# Patient Record
Sex: Female | Born: 1945 | Race: White | Hispanic: No | Smoking: Former smoker
Health system: Southern US, Community
[De-identification: ages and names within clinical notes are randomized; demographics above are authoritative.]

## PROBLEM LIST (undated history)

## (undated) DIAGNOSIS — J449 Chronic obstructive pulmonary disease, unspecified: Secondary | ICD-10-CM

## (undated) DIAGNOSIS — F419 Anxiety disorder, unspecified: Secondary | ICD-10-CM

## (undated) DIAGNOSIS — J9620 Acute and chronic respiratory failure, unspecified whether with hypoxia or hypercapnia: Secondary | ICD-10-CM

## (undated) DIAGNOSIS — I639 Cerebral infarction, unspecified: Secondary | ICD-10-CM

## (undated) DIAGNOSIS — N189 Chronic kidney disease, unspecified: Secondary | ICD-10-CM

## (undated) DIAGNOSIS — C50919 Malignant neoplasm of unspecified site of unspecified female breast: Secondary | ICD-10-CM

## (undated) DIAGNOSIS — I509 Heart failure, unspecified: Secondary | ICD-10-CM

## (undated) DIAGNOSIS — I2510 Atherosclerotic heart disease of native coronary artery without angina pectoris: Secondary | ICD-10-CM

## (undated) DIAGNOSIS — I10 Essential (primary) hypertension: Secondary | ICD-10-CM

## (undated) DIAGNOSIS — E119 Type 2 diabetes mellitus without complications: Secondary | ICD-10-CM

## (undated) HISTORY — PX: JOINT REPLACEMENT: SHX530

## (undated) HISTORY — PX: ABDOMINAL HYSTERECTOMY: SHX81

## (undated) HISTORY — PX: CORONARY ARTERY BYPASS GRAFT: SHX141

---

## 2003-11-18 ENCOUNTER — Other Ambulatory Visit: Payer: Self-pay

## 2003-11-18 ENCOUNTER — Inpatient Hospital Stay: Payer: Self-pay | Admitting: Internal Medicine

## 2003-11-20 ENCOUNTER — Other Ambulatory Visit: Payer: Self-pay

## 2003-11-21 ENCOUNTER — Other Ambulatory Visit: Payer: Self-pay

## 2005-10-06 ENCOUNTER — Ambulatory Visit: Payer: Self-pay | Admitting: Otolaryngology

## 2005-10-21 ENCOUNTER — Ambulatory Visit: Payer: Self-pay | Admitting: Otolaryngology

## 2006-02-23 ENCOUNTER — Ambulatory Visit: Payer: Self-pay | Admitting: Internal Medicine

## 2006-11-10 ENCOUNTER — Encounter: Payer: Self-pay | Admitting: Otolaryngology

## 2006-11-17 ENCOUNTER — Other Ambulatory Visit: Payer: Self-pay

## 2006-11-17 ENCOUNTER — Observation Stay: Payer: Self-pay | Admitting: Internal Medicine

## 2006-11-18 ENCOUNTER — Other Ambulatory Visit: Payer: Self-pay

## 2006-11-27 ENCOUNTER — Encounter: Payer: Self-pay | Admitting: Otolaryngology

## 2006-12-27 ENCOUNTER — Encounter: Payer: Self-pay | Admitting: Otolaryngology

## 2007-01-27 ENCOUNTER — Encounter: Payer: Self-pay | Admitting: Otolaryngology

## 2007-10-20 ENCOUNTER — Other Ambulatory Visit: Payer: Self-pay

## 2007-10-20 ENCOUNTER — Inpatient Hospital Stay: Payer: Self-pay | Admitting: Internal Medicine

## 2011-01-25 ENCOUNTER — Inpatient Hospital Stay: Payer: Self-pay | Admitting: *Deleted

## 2011-01-29 LAB — CBC WITH DIFFERENTIAL/PLATELET
Basophil #: 0 10*3/uL (ref 0.0–0.1)
Eosinophil #: 0 10*3/uL (ref 0.0–0.7)
Lymphocyte #: 0.6 10*3/uL — ABNORMAL LOW (ref 1.0–3.6)
Lymphocyte %: 3.4 %
MCHC: 33.2 g/dL (ref 32.0–36.0)
MCV: 86 fL (ref 80–100)
Monocyte #: 0.7 10*3/uL (ref 0.0–0.7)
Monocyte %: 4 %
Neutrophil #: 17 10*3/uL — ABNORMAL HIGH (ref 1.4–6.5)
Platelet: 299 10*3/uL (ref 150–440)
RBC: 4.53 10*6/uL (ref 3.80–5.20)
RDW: 13.6 % (ref 11.5–14.5)
WBC: 18.4 10*3/uL — ABNORMAL HIGH (ref 3.6–11.0)

## 2011-01-29 LAB — BASIC METABOLIC PANEL
Anion Gap: 10 (ref 7–16)
EGFR (African American): 32 — ABNORMAL LOW
EGFR (Non-African Amer.): 26 — ABNORMAL LOW
Glucose: 327 mg/dL — ABNORMAL HIGH (ref 65–99)
Osmolality: 296 (ref 275–301)
Potassium: 4.5 mmol/L (ref 3.5–5.1)
Sodium: 130 mmol/L — ABNORMAL LOW (ref 136–145)

## 2011-01-30 LAB — BASIC METABOLIC PANEL
Anion Gap: 11 (ref 7–16)
Chloride: 93 mmol/L — ABNORMAL LOW (ref 98–107)
Co2: 26 mmol/L (ref 21–32)
EGFR (Non-African Amer.): 31 — ABNORMAL LOW
Glucose: 290 mg/dL — ABNORMAL HIGH (ref 65–99)
Osmolality: 300 (ref 275–301)
Potassium: 4.7 mmol/L (ref 3.5–5.1)

## 2011-01-30 LAB — CBC WITH DIFFERENTIAL/PLATELET
Basophil #: 0 10*3/uL (ref 0.0–0.1)
Eosinophil #: 0 10*3/uL (ref 0.0–0.7)
HCT: 38.3 % (ref 35.0–47.0)
Lymphocyte #: 0.9 10*3/uL — ABNORMAL LOW (ref 1.0–3.6)
Lymphocyte %: 5.8 %
MCH: 28.7 pg (ref 26.0–34.0)
MCHC: 33.7 g/dL (ref 32.0–36.0)
MCV: 85 fL (ref 80–100)
Neutrophil #: 12.9 10*3/uL — ABNORMAL HIGH (ref 1.4–6.5)
RBC: 4.5 10*6/uL (ref 3.80–5.20)
RDW: 13.6 % (ref 11.5–14.5)

## 2011-01-31 LAB — CBC WITH DIFFERENTIAL/PLATELET
Basophil #: 0 10*3/uL (ref 0.0–0.1)
Eosinophil #: 0 10*3/uL (ref 0.0–0.7)
HCT: 38.9 % (ref 35.0–47.0)
HGB: 13.1 g/dL (ref 12.0–16.0)
Lymphocyte %: 10.4 %
MCHC: 33.6 g/dL (ref 32.0–36.0)
Monocyte #: 1.7 10*3/uL — ABNORMAL HIGH (ref 0.0–0.7)
Monocyte %: 10.7 %
Neutrophil #: 12.5 10*3/uL — ABNORMAL HIGH (ref 1.4–6.5)
Neutrophil %: 78.7 %
Platelet: 339 10*3/uL (ref 150–440)
RDW: 13.6 % (ref 11.5–14.5)
WBC: 15.9 10*3/uL — ABNORMAL HIGH (ref 3.6–11.0)

## 2011-01-31 LAB — BASIC METABOLIC PANEL
Anion Gap: 9 (ref 7–16)
BUN: 91 mg/dL — ABNORMAL HIGH (ref 7–18)
Calcium, Total: 8.8 mg/dL (ref 8.5–10.1)
Chloride: 93 mmol/L — ABNORMAL LOW (ref 98–107)
Co2: 30 mmol/L (ref 21–32)
Creatinine: 1.71 mg/dL — ABNORMAL HIGH (ref 0.60–1.30)
EGFR (African American): 39 — ABNORMAL LOW
Osmolality: 299 (ref 275–301)
Potassium: 4.6 mmol/L (ref 3.5–5.1)

## 2011-01-31 LAB — PHOSPHORUS: Phosphorus: 3.6 mg/dL (ref 2.5–4.9)

## 2011-02-01 LAB — BASIC METABOLIC PANEL
Anion Gap: 12 (ref 7–16)
BUN: 80 mg/dL — ABNORMAL HIGH (ref 7–18)
Co2: 29 mmol/L (ref 21–32)
Creatinine: 1.49 mg/dL — ABNORMAL HIGH (ref 0.60–1.30)
EGFR (African American): 45 — ABNORMAL LOW
EGFR (Non-African Amer.): 37 — ABNORMAL LOW
Glucose: 230 mg/dL — ABNORMAL HIGH (ref 65–99)
Potassium: 4.5 mmol/L (ref 3.5–5.1)
Sodium: 135 mmol/L — ABNORMAL LOW (ref 136–145)

## 2011-02-01 LAB — CREATININE CLEARANCE, URINE, 24 HOUR
Creatinine, Serum: 1.49 mg/dL — ABNORMAL HIGH (ref 0.50–1.20)
Creatinine, Urine: 68.4 mg/dL (ref 30.0–125.0)
Total Volume: 2300 mL

## 2011-02-12 ENCOUNTER — Encounter: Payer: Self-pay | Admitting: Internal Medicine

## 2011-02-12 LAB — BASIC METABOLIC PANEL
Calcium, Total: 10 mg/dL (ref 8.5–10.1)
Co2: 25 mmol/L (ref 21–32)
EGFR (Non-African Amer.): 47 — ABNORMAL LOW
Glucose: 157 mg/dL — ABNORMAL HIGH (ref 65–99)
Osmolality: 289 (ref 275–301)
Potassium: 4 mmol/L (ref 3.5–5.1)
Sodium: 142 mmol/L (ref 136–145)

## 2011-02-25 ENCOUNTER — Ambulatory Visit: Payer: Self-pay | Admitting: Internal Medicine

## 2011-02-27 ENCOUNTER — Encounter: Payer: Self-pay | Admitting: Internal Medicine

## 2011-03-24 LAB — CBC
HCT: 36.5 % (ref 35.0–47.0)
MCHC: 32.9 g/dL (ref 32.0–36.0)
MCV: 85 fL (ref 80–100)
Platelet: 319 10*3/uL (ref 150–440)
RDW: 14.6 % — ABNORMAL HIGH (ref 11.5–14.5)
WBC: 16.4 10*3/uL — ABNORMAL HIGH (ref 3.6–11.0)

## 2011-03-24 LAB — COMPREHENSIVE METABOLIC PANEL
Alkaline Phosphatase: 61 U/L (ref 50–136)
BUN: 41 mg/dL — ABNORMAL HIGH (ref 7–18)
Bilirubin,Total: 0.2 mg/dL (ref 0.2–1.0)
Chloride: 102 mmol/L (ref 98–107)
Creatinine: 1.79 mg/dL — ABNORMAL HIGH (ref 0.60–1.30)
EGFR (African American): 37 — ABNORMAL LOW
EGFR (Non-African Amer.): 30 — ABNORMAL LOW
Osmolality: 290 (ref 275–301)
SGPT (ALT): 17 U/L
Total Protein: 7.6 g/dL (ref 6.4–8.2)

## 2011-03-25 ENCOUNTER — Inpatient Hospital Stay: Payer: Self-pay | Admitting: Internal Medicine

## 2011-03-25 LAB — CK TOTAL AND CKMB (NOT AT ARMC)
CK, Total: 44 U/L (ref 21–215)
CK-MB: 0.6 ng/mL (ref 0.5–3.6)

## 2011-03-25 LAB — TROPONIN I
Troponin-I: 0.03 ng/mL
Troponin-I: 0.02 ng/mL

## 2011-03-26 LAB — BASIC METABOLIC PANEL
BUN: 42 mg/dL — ABNORMAL HIGH (ref 7–18)
Calcium, Total: 9.6 mg/dL (ref 8.5–10.1)
Chloride: 103 mmol/L (ref 98–107)
Co2: 25 mmol/L (ref 21–32)
Creatinine: 1.65 mg/dL — ABNORMAL HIGH (ref 0.60–1.30)
EGFR (African American): 40 — ABNORMAL LOW
EGFR (Non-African Amer.): 33 — ABNORMAL LOW
Osmolality: 291 (ref 275–301)
Potassium: 4.1 mmol/L (ref 3.5–5.1)
Sodium: 141 mmol/L (ref 136–145)

## 2011-07-15 ENCOUNTER — Emergency Department: Payer: Self-pay

## 2011-07-15 LAB — URINALYSIS, COMPLETE
Blood: NEGATIVE
Glucose,UR: NEGATIVE mg/dL (ref 0–75)
Ketone: NEGATIVE
Nitrite: POSITIVE
Ph: 5 (ref 4.5–8.0)
Protein: 100
RBC,UR: 11 /HPF (ref 0–5)
Specific Gravity: 1.027 (ref 1.003–1.030)
Squamous Epithelial: 2
WBC UR: 693 /HPF (ref 0–5)

## 2011-07-15 LAB — BASIC METABOLIC PANEL
Anion Gap: 9 (ref 7–16)
BUN: 29 mg/dL — ABNORMAL HIGH (ref 7–18)
Chloride: 104 mmol/L (ref 98–107)
EGFR (African American): >60
Glucose: 92 mg/dL (ref 65–99)
Sodium: 139 mmol/L (ref 136–145)

## 2011-07-15 LAB — TROPONIN I: Troponin-I: <0.02 ng/mL

## 2011-07-15 LAB — CBC
HCT: 36 % (ref 35.0–47.0)
HGB: 11.3 g/dL — ABNORMAL LOW (ref 12.0–16.0)
MCV: 82 fL (ref 80–100)
RDW: 17.1 % — ABNORMAL HIGH (ref 11.5–14.5)

## 2011-07-15 LAB — CK TOTAL AND CKMB (NOT AT ARMC): CK-MB: 1.4 ng/mL (ref 0.5–3.6)

## 2011-07-15 LAB — PRO B NATRIURETIC PEPTIDE: B-Type Natriuretic Peptide: 842 pg/mL — ABNORMAL HIGH (ref 0–125)

## 2011-07-22 ENCOUNTER — Inpatient Hospital Stay: Payer: Self-pay | Admitting: Internal Medicine

## 2011-07-22 LAB — PROTIME-INR
INR: 1.3
Prothrombin Time: 16.3 secs — ABNORMAL HIGH (ref 11.5–14.7)

## 2011-07-22 LAB — BASIC METABOLIC PANEL
BUN: 50 mg/dL — ABNORMAL HIGH (ref 7–18)
Calcium, Total: 8.9 mg/dL (ref 8.5–10.1)
Chloride: 103 mmol/L (ref 98–107)
Creatinine: 2.24 mg/dL — ABNORMAL HIGH (ref 0.60–1.30)
EGFR (African American): 26 — ABNORMAL LOW
EGFR (Non-African Amer.): 22 — ABNORMAL LOW
Glucose: 134 mg/dL — ABNORMAL HIGH (ref 65–99)
Potassium: 4.9 mmol/L (ref 3.5–5.1)
Sodium: 136 mmol/L (ref 136–145)

## 2011-07-22 LAB — CBC
HCT: 33.6 % — ABNORMAL LOW (ref 35.0–47.0)
MCHC: 32.4 g/dL (ref 32.0–36.0)
MCV: 81 fL (ref 80–100)
RBC: 4.14 10*6/uL (ref 3.80–5.20)
RDW: 17 % — ABNORMAL HIGH (ref 11.5–14.5)
WBC: 20.4 10*3/uL — ABNORMAL HIGH (ref 3.6–11.0)

## 2011-07-22 LAB — CK TOTAL AND CKMB (NOT AT ARMC): CK-MB: 6.4 ng/mL — ABNORMAL HIGH (ref 0.5–3.6)

## 2011-07-23 LAB — CBC WITH DIFFERENTIAL/PLATELET
Basophil %: 0.2 %
Eosinophil #: 0.6 10*3/uL (ref 0.0–0.7)
Eosinophil %: 3.6 %
HCT: 33.7 % — ABNORMAL LOW (ref 35.0–47.0)
HGB: 10.7 g/dL — ABNORMAL LOW (ref 12.0–16.0)
Lymphocyte #: 1 10*3/uL (ref 1.0–3.6)
Lymphocyte %: 5.4 %
MCH: 26.1 pg (ref 26.0–34.0)
MCHC: 31.8 g/dL — ABNORMAL LOW (ref 32.0–36.0)
MCV: 82 fL (ref 80–100)
Monocyte #: 0.4 x10 3/mm (ref 0.2–0.9)
Monocyte %: 2.4 %
Neutrophil #: 15.7 10*3/uL — ABNORMAL HIGH (ref 1.4–6.5)
Neutrophil %: 88.4 %
Platelet: 282 10*3/uL (ref 150–440)
RBC: 4.11 10*6/uL (ref 3.80–5.20)
RDW: 16.8 % — ABNORMAL HIGH (ref 11.5–14.5)

## 2011-07-23 LAB — CK TOTAL AND CKMB (NOT AT ARMC)
CK-MB: 5 ng/mL — ABNORMAL HIGH (ref 0.5–3.6)
CK-MB: 3.3 ng/mL (ref 0.5–3.6)

## 2011-07-23 LAB — LIPID PANEL
HDL Cholesterol: 28 mg/dL — ABNORMAL LOW (ref 40–60)
Triglycerides: 138 mg/dL (ref 0–200)
VLDL Cholesterol, Calc: 28 mg/dL (ref 5–40)

## 2011-07-23 LAB — BASIC METABOLIC PANEL
Anion Gap: 9 (ref 7–16)
Chloride: 99 mmol/L (ref 98–107)
Creatinine: 2.27 mg/dL — ABNORMAL HIGH (ref 0.60–1.30)
EGFR (African American): 25 — ABNORMAL LOW
Osmolality: 289 (ref 275–301)
Sodium: 133 mmol/L — ABNORMAL LOW (ref 136–145)

## 2011-07-23 LAB — APTT
Activated PTT: 35.8 secs (ref 23.6–35.9)
Activated PTT: 29.8 secs (ref 23.6–35.9)

## 2011-07-23 LAB — HEMOGLOBIN A1C: Hemoglobin A1C: 6.2 % (ref 4.2–6.3)

## 2011-07-23 LAB — TROPONIN I: Troponin-I: 1.3 ng/mL — ABNORMAL HIGH

## 2011-07-23 LAB — POTASSIUM: Potassium: 4.8 mmol/L (ref 3.5–5.1)

## 2011-07-24 LAB — URINALYSIS, COMPLETE
Glucose,UR: 50 mg/dL (ref 0–75)
Ketone: NEGATIVE
Protein: 30
RBC,UR: <1 /HPF (ref 0–5)
Squamous Epithelial: 10
WBC UR: 1 /HPF (ref 0–5)

## 2011-07-24 LAB — CBC WITH DIFFERENTIAL/PLATELET
Basophil #: 0.2 10*3/uL — ABNORMAL HIGH (ref 0.0–0.1)
Basophil %: 1.1 %
Eosinophil #: 0.1 10*3/uL (ref 0.0–0.7)
Eosinophil %: 0.3 %
HCT: 33.6 % — ABNORMAL LOW (ref 35.0–47.0)
HGB: 10.3 g/dL — ABNORMAL LOW (ref 12.0–16.0)
Lymphocyte %: 7.6 %
MCH: 25.2 pg — ABNORMAL LOW (ref 26.0–34.0)
Monocyte #: 0.9 x10 3/mm (ref 0.2–0.9)
Monocyte %: 4.4 %
RBC: 4.11 10*6/uL (ref 3.80–5.20)
RDW: 16.8 % — ABNORMAL HIGH (ref 11.5–14.5)
WBC: 21.1 10*3/uL — ABNORMAL HIGH (ref 3.6–11.0)

## 2011-07-24 LAB — APTT
Activated PTT: <23 secs — ABNORMAL LOW (ref 23.6–35.9)
Activated PTT: 49.5 secs — ABNORMAL HIGH (ref 23.6–35.9)

## 2011-07-24 LAB — BASIC METABOLIC PANEL
Anion Gap: 11 (ref 7–16)
Calcium, Total: 9.1 mg/dL (ref 8.5–10.1)
Chloride: 94 mmol/L — ABNORMAL LOW (ref 98–107)
Co2: 25 mmol/L (ref 21–32)
EGFR (Non-African Amer.): 30 — ABNORMAL LOW
Glucose: 410 mg/dL — ABNORMAL HIGH (ref 65–99)
Osmolality: 295 (ref 275–301)
Potassium: 5 mmol/L (ref 3.5–5.1)
Sodium: 130 mmol/L — ABNORMAL LOW (ref 136–145)

## 2011-07-25 LAB — CBC WITH DIFFERENTIAL/PLATELET
Basophil #: 0 10*3/uL (ref 0.0–0.1)
HCT: 35.5 % (ref 35.0–47.0)
MCH: 25.3 pg — ABNORMAL LOW (ref 26.0–34.0)
MCHC: 31 g/dL — ABNORMAL LOW (ref 32.0–36.0)
MCV: 82 fL (ref 80–100)
Monocyte %: 5.3 %
Neutrophil %: 88.7 %
Platelet: 371 10*3/uL (ref 150–440)
RBC: 4.34 10*6/uL (ref 3.80–5.20)

## 2011-08-02 ENCOUNTER — Inpatient Hospital Stay: Payer: Self-pay | Admitting: Internal Medicine

## 2011-08-02 LAB — CBC
HCT: 37.4 % (ref 35.0–47.0)
HGB: 11.5 g/dL — ABNORMAL LOW (ref 12.0–16.0)
MCV: 83 fL (ref 80–100)
Platelet: 355 10*3/uL (ref 150–440)
RBC: 4.49 10*6/uL (ref 3.80–5.20)
RDW: 17.5 % — ABNORMAL HIGH (ref 11.5–14.5)
WBC: 23.9 10*3/uL — ABNORMAL HIGH (ref 3.6–11.0)

## 2011-08-02 LAB — COMPREHENSIVE METABOLIC PANEL
Alkaline Phosphatase: 83 U/L (ref 50–136)
Calcium, Total: 9.5 mg/dL (ref 8.5–10.1)
Chloride: 103 mmol/L (ref 98–107)
Co2: 28 mmol/L (ref 21–32)
Creatinine: 1.87 mg/dL — ABNORMAL HIGH (ref 0.60–1.30)
EGFR (African American): 32 — ABNORMAL LOW
EGFR (Non-African Amer.): 28 — ABNORMAL LOW
Osmolality: 282 (ref 275–301)
Potassium: 4.9 mmol/L (ref 3.5–5.1)
SGOT(AST): 17 U/L (ref 15–37)
SGPT (ALT): 24 U/L
Sodium: 136 mmol/L (ref 136–145)

## 2011-08-02 LAB — URINALYSIS, COMPLETE
Blood: NEGATIVE
Glucose,UR: NEGATIVE mg/dL (ref 0–75)
Hyaline Cast: 3
Nitrite: NEGATIVE
Ph: 5 (ref 4.5–8.0)
Protein: 100
RBC,UR: 3 /HPF (ref 0–5)
WBC UR: 18 /HPF (ref 0–5)

## 2011-08-02 LAB — CK TOTAL AND CKMB (NOT AT ARMC)
CK, Total: 37 U/L (ref 21–215)
CK-MB: 1.7 ng/mL (ref 0.5–3.6)

## 2011-08-02 LAB — TROPONIN I: Troponin-I: 0.11 ng/mL — ABNORMAL HIGH

## 2011-08-03 LAB — COMPREHENSIVE METABOLIC PANEL
Albumin: 2.5 g/dL — ABNORMAL LOW (ref 3.4–5.0)
Alkaline Phosphatase: 75 U/L (ref 50–136)
Anion Gap: 8 (ref 7–16)
BUN: 48 mg/dL — ABNORMAL HIGH (ref 7–18)
Bilirubin,Total: 0.3 mg/dL (ref 0.2–1.0)
Chloride: 102 mmol/L (ref 98–107)
Creatinine: 1.92 mg/dL — ABNORMAL HIGH (ref 0.60–1.30)
EGFR (African American): 31 — ABNORMAL LOW
Glucose: 145 mg/dL — ABNORMAL HIGH (ref 65–99)
Osmolality: 291 (ref 275–301)
Potassium: 4.8 mmol/L (ref 3.5–5.1)
SGOT(AST): 16 U/L (ref 15–37)
Sodium: 138 mmol/L (ref 136–145)
Total Protein: 6.6 g/dL (ref 6.4–8.2)

## 2011-08-03 LAB — LIPID PANEL
Ldl Cholesterol, Calc: 45 mg/dL (ref 0–100)
Triglycerides: 110 mg/dL (ref 0–200)
VLDL Cholesterol, Calc: 22 mg/dL (ref 5–40)

## 2011-08-03 LAB — CK TOTAL AND CKMB (NOT AT ARMC)
CK, Total: 48 U/L (ref 21–215)
CK-MB: 2 ng/mL (ref 0.5–3.6)
CK-MB: 1.5 ng/mL (ref 0.5–3.6)

## 2011-08-03 LAB — CBC WITH DIFFERENTIAL/PLATELET
HGB: 10.8 g/dL — ABNORMAL LOW (ref 12.0–16.0)
Lymphocytes: 10 %
MCH: 26 pg (ref 26.0–34.0)
MCHC: 31.3 g/dL — ABNORMAL LOW (ref 32.0–36.0)
MCV: 83 fL (ref 80–100)
Platelet: 250 10*3/uL (ref 150–440)
RBC: 4.16 10*6/uL (ref 3.80–5.20)
RDW: 17.1 % — ABNORMAL HIGH (ref 11.5–14.5)
Segmented Neutrophils: 72 %

## 2011-08-03 LAB — HEMOGLOBIN A1C: Hemoglobin A1C: 6.8 % — ABNORMAL HIGH (ref 4.2–6.3)

## 2011-08-04 LAB — BASIC METABOLIC PANEL
BUN: 38 mg/dL — ABNORMAL HIGH (ref 7–18)
Calcium, Total: 9.3 mg/dL (ref 8.5–10.1)
Chloride: 100 mmol/L (ref 98–107)
Creatinine: 1.66 mg/dL — ABNORMAL HIGH (ref 0.60–1.30)
EGFR (African American): 37 — ABNORMAL LOW
EGFR (Non-African Amer.): 32 — ABNORMAL LOW
Glucose: 143 mg/dL — ABNORMAL HIGH (ref 65–99)
Osmolality: 287 (ref 275–301)
Potassium: 4.4 mmol/L (ref 3.5–5.1)

## 2011-08-08 LAB — CULTURE, BLOOD (SINGLE)

## 2011-08-27 ENCOUNTER — Ambulatory Visit: Payer: Self-pay | Admitting: Internal Medicine

## 2011-08-29 ENCOUNTER — Emergency Department: Payer: Self-pay | Admitting: Emergency Medicine

## 2011-09-10 ENCOUNTER — Inpatient Hospital Stay: Payer: Self-pay | Admitting: Specialist

## 2011-09-10 LAB — BASIC METABOLIC PANEL
BUN: 34 mg/dL — ABNORMAL HIGH (ref 7–18)
Calcium, Total: 8.9 mg/dL (ref 8.5–10.1)
Chloride: 106 mmol/L (ref 98–107)
Co2: 25 mmol/L (ref 21–32)
Creatinine: 1.48 mg/dL — ABNORMAL HIGH (ref 0.60–1.30)
EGFR (Non-African Amer.): 37 — ABNORMAL LOW
Potassium: 4.7 mmol/L (ref 3.5–5.1)
Sodium: 139 mmol/L (ref 136–145)

## 2011-09-10 LAB — CBC WITH DIFFERENTIAL/PLATELET
Basophil #: 0.1 10*3/uL (ref 0.0–0.1)
Basophil #: 0.1 10*3/uL (ref 0.0–0.1)
Basophil #: 0.2 10*3/uL — ABNORMAL HIGH (ref 0.0–0.1)
Basophil %: 0.2 %
Eosinophil #: 0 10*3/uL (ref 0.0–0.7)
Eosinophil #: 0.4 10*3/uL (ref 0.0–0.7)
Eosinophil #: 0.5 10*3/uL (ref 0.0–0.7)
Eosinophil %: 1.6 %
HCT: 37.4 % (ref 35.0–47.0)
HGB: 9.9 g/dL — ABNORMAL LOW (ref 12.0–16.0)
Lymphocyte #: 0.4 10*3/uL — ABNORMAL LOW (ref 1.0–3.6)
Lymphocyte #: 4.5 10*3/uL — ABNORMAL HIGH (ref 1.0–3.6)
Lymphocyte %: 2.4 %
Lymphocyte %: 21.7 %
MCHC: 30.8 g/dL — ABNORMAL LOW (ref 32.0–36.0)
MCHC: 31 g/dL — ABNORMAL LOW (ref 32.0–36.0)
MCV: 84 fL (ref 80–100)
MCV: 84 fL (ref 80–100)
MCV: 86 fL (ref 80–100)
Monocyte #: 0.3 x10 3/mm (ref 0.2–0.9)
Monocyte #: 0.3 x10 3/mm (ref 0.2–0.9)
Neutrophil #: 14.6 10*3/uL — ABNORMAL HIGH (ref 1.4–6.5)
Neutrophil #: 17.5 10*3/uL — ABNORMAL HIGH (ref 1.4–6.5)
Neutrophil %: 70.7 %
Neutrophil %: 95 %
Neutrophil %: 95.2 %
Platelet: 265 10*3/uL (ref 150–440)
Platelet: 332 10*3/uL (ref 150–440)
Platelet: 443 10*3/uL — ABNORMAL HIGH (ref 150–440)
RBC: 3.8 10*6/uL (ref 3.80–5.20)
RBC: 4.34 10*6/uL (ref 3.80–5.20)
RDW: 16.7 % — ABNORMAL HIGH (ref 11.5–14.5)
RDW: 16.8 % — ABNORMAL HIGH (ref 11.5–14.5)
RDW: 16.9 % — ABNORMAL HIGH (ref 11.5–14.5)
WBC: 23 10*3/uL — ABNORMAL HIGH (ref 3.6–11.0)
WBC: 18.3 10*3/uL — ABNORMAL HIGH (ref 3.6–11.0)

## 2011-09-10 LAB — COMPREHENSIVE METABOLIC PANEL
Anion Gap: 11 (ref 7–16)
Calcium, Total: 9.4 mg/dL (ref 8.5–10.1)
Chloride: 104 mmol/L (ref 98–107)
Co2: 22 mmol/L (ref 21–32)
EGFR (African American): 41 — ABNORMAL LOW
Osmolality: 293 (ref 275–301)
Potassium: 5.2 mmol/L — ABNORMAL HIGH (ref 3.5–5.1)
SGOT(AST): 49 U/L — ABNORMAL HIGH (ref 15–37)
SGPT (ALT): 32 U/L (ref 12–78)
Total Protein: 7.7 g/dL (ref 6.4–8.2)

## 2011-09-10 LAB — APTT: Activated PTT: <23 secs — ABNORMAL LOW (ref 23.6–35.9)

## 2011-09-10 LAB — URINALYSIS, COMPLETE
Blood: NEGATIVE
Ph: 6 (ref 4.5–8.0)
Protein: 100
RBC,UR: 7 /HPF (ref 0–5)
Specific Gravity: 1.014 (ref 1.003–1.030)

## 2011-09-10 LAB — PROTIME-INR
INR: 1
Prothrombin Time: 13.1 secs (ref 11.5–14.7)

## 2011-09-10 LAB — CK TOTAL AND CKMB (NOT AT ARMC)
CK, Total: 269 U/L — ABNORMAL HIGH (ref 21–215)
CK, Total: 94 U/L (ref 21–215)
CK-MB: 10.5 ng/mL — ABNORMAL HIGH (ref 0.5–3.6)
CK-MB: 2.1 ng/mL (ref 0.5–3.6)

## 2011-09-10 LAB — TROPONIN I: Troponin-I: 1.29 ng/mL — ABNORMAL HIGH

## 2011-09-11 LAB — CBC WITH DIFFERENTIAL/PLATELET
Basophil #: 0 10*3/uL (ref 0.0–0.1)
Eosinophil #: 0.1 10*3/uL (ref 0.0–0.7)
Eosinophil %: 0.3 %
HGB: 8.7 g/dL — ABNORMAL LOW (ref 12.0–16.0)
Lymphocyte #: 1.7 10*3/uL (ref 1.0–3.6)
MCH: 26.4 pg (ref 26.0–34.0)
MCV: 83 fL (ref 80–100)
Monocyte #: 1.4 x10 3/mm — ABNORMAL HIGH (ref 0.2–0.9)
Monocyte %: 6.7 %
Neutrophil %: 84.4 %
Platelet: 237 10*3/uL (ref 150–440)
RBC: 3.28 10*6/uL — ABNORMAL LOW (ref 3.80–5.20)
WBC: 20.4 10*3/uL — ABNORMAL HIGH (ref 3.6–11.0)

## 2011-09-11 LAB — APTT
Activated PTT: 66.7 secs — ABNORMAL HIGH (ref 23.6–35.9)
Activated PTT: 48.1 secs — ABNORMAL HIGH (ref 23.6–35.9)

## 2011-09-12 LAB — CBC WITH DIFFERENTIAL/PLATELET
Basophil %: 0.5 %
HGB: 8.9 g/dL — ABNORMAL LOW (ref 12.0–16.0)
Lymphocyte #: 2.9 10*3/uL (ref 1.0–3.6)
Lymphocyte %: 20 %
MCHC: 31.9 g/dL — ABNORMAL LOW (ref 32.0–36.0)
MCV: 84 fL (ref 80–100)
Monocyte #: 1.1 x10 3/mm — ABNORMAL HIGH (ref 0.2–0.9)
Monocyte %: 8 %
Neutrophil %: 68 %
RBC: 3.33 10*6/uL — ABNORMAL LOW (ref 3.80–5.20)
WBC: 14.4 10*3/uL — ABNORMAL HIGH (ref 3.6–11.0)

## 2011-09-12 LAB — APTT
Activated PTT: 57 secs — ABNORMAL HIGH (ref 23.6–35.9)
Activated PTT: 72.8 secs — ABNORMAL HIGH (ref 23.6–35.9)

## 2011-09-12 LAB — CREATININE, SERUM
Creatinine: 1.45 mg/dL — ABNORMAL HIGH (ref 0.60–1.30)
EGFR (Non-African Amer.): 37 — ABNORMAL LOW

## 2011-09-12 LAB — URINE CULTURE

## 2011-09-13 LAB — CBC WITH DIFFERENTIAL/PLATELET
Basophil %: 1 %
Eosinophil %: 5.1 %
HCT: 31.5 % — ABNORMAL LOW (ref 35.0–47.0)
HGB: 9.8 g/dL — ABNORMAL LOW (ref 12.0–16.0)
Lymphocyte #: 2.3 10*3/uL (ref 1.0–3.6)
MCH: 26 pg (ref 26.0–34.0)
MCV: 83 fL (ref 80–100)
Monocyte #: 1.1 x10 3/mm — ABNORMAL HIGH (ref 0.2–0.9)
Monocyte %: 8.2 %
Neutrophil #: 9.4 10*3/uL — ABNORMAL HIGH (ref 1.4–6.5)
RBC: 3.78 10*6/uL — ABNORMAL LOW (ref 3.80–5.20)
WBC: 13.7 10*3/uL — ABNORMAL HIGH (ref 3.6–11.0)

## 2011-09-13 LAB — BASIC METABOLIC PANEL
Anion Gap: 6 — ABNORMAL LOW (ref 7–16)
BUN: 24 mg/dL — ABNORMAL HIGH (ref 7–18)
Calcium, Total: 9.2 mg/dL (ref 8.5–10.1)
Chloride: 109 mmol/L — ABNORMAL HIGH (ref 98–107)
Co2: 27 mmol/L (ref 21–32)
Glucose: 138 mg/dL — ABNORMAL HIGH (ref 65–99)
Osmolality: 289 (ref 275–301)
Potassium: 4.2 mmol/L (ref 3.5–5.1)

## 2011-09-13 LAB — IRON AND TIBC
Iron Saturation: 19 %
Iron: 62 ug/dL (ref 50–170)
Unbound Iron-Bind.Cap.: 268 ug/dL

## 2011-09-13 LAB — FERRITIN: Ferritin (ARMC): 77 ng/mL (ref 8–388)

## 2011-09-14 LAB — CBC WITH DIFFERENTIAL/PLATELET
Basophil #: 0.1 10*3/uL (ref 0.0–0.1)
Eosinophil #: 0.6 10*3/uL (ref 0.0–0.7)
Lymphocyte %: 14 %
MCH: 27 pg (ref 26.0–34.0)
MCHC: 32.2 g/dL (ref 32.0–36.0)
Monocyte #: 0.8 x10 3/mm (ref 0.2–0.9)
Neutrophil %: 74.3 %
Platelet: 281 10*3/uL (ref 150–440)
RDW: 16.9 % — ABNORMAL HIGH (ref 11.5–14.5)

## 2011-09-14 LAB — BASIC METABOLIC PANEL
Calcium, Total: 9.4 mg/dL (ref 8.5–10.1)
Creatinine: 1.06 mg/dL (ref 0.60–1.30)
EGFR (African American): >60
EGFR (Non-African Amer.): 55 — ABNORMAL LOW
Glucose: 147 mg/dL — ABNORMAL HIGH (ref 65–99)
Potassium: 4.1 mmol/L (ref 3.5–5.1)
Sodium: 140 mmol/L (ref 136–145)

## 2011-09-16 LAB — CULTURE, BLOOD (SINGLE)

## 2011-09-18 LAB — CULTURE, BLOOD (SINGLE)

## 2011-09-24 ENCOUNTER — Inpatient Hospital Stay: Payer: Self-pay | Admitting: Family Medicine

## 2011-09-24 LAB — PROTIME-INR
INR: 1.1
Prothrombin Time: 14.4 secs (ref 11.5–14.7)

## 2011-09-24 LAB — URINALYSIS, COMPLETE
Bacteria: NONE SEEN
Bilirubin,UR: NEGATIVE
Ketone: NEGATIVE
Leukocyte Esterase: NEGATIVE
Ph: 5 (ref 4.5–8.0)
Protein: >=500
RBC,UR: 6 /HPF (ref 0–5)
Squamous Epithelial: <1
WBC UR: 13 /HPF (ref 0–5)

## 2011-09-24 LAB — CK TOTAL AND CKMB (NOT AT ARMC)
CK, Total: 25 U/L (ref 21–215)
CK, Total: 33 U/L (ref 21–215)
CK, Total: 29 U/L (ref 21–215)
CK-MB: 1.3 ng/mL (ref 0.5–3.6)
CK-MB: 1.9 ng/mL (ref 0.5–3.6)

## 2011-09-24 LAB — COMPREHENSIVE METABOLIC PANEL
Albumin: 3.1 g/dL — ABNORMAL LOW (ref 3.4–5.0)
Alkaline Phosphatase: 73 U/L (ref 50–136)
BUN: 30 mg/dL — ABNORMAL HIGH (ref 7–18)
Bilirubin,Total: 0.3 mg/dL (ref 0.2–1.0)
Creatinine: 1.52 mg/dL — ABNORMAL HIGH (ref 0.60–1.30)
Osmolality: 294 (ref 275–301)
SGOT(AST): 20 U/L (ref 15–37)
SGPT (ALT): 22 U/L (ref 12–78)
Sodium: 141 mmol/L (ref 136–145)
Total Protein: 7.2 g/dL (ref 6.4–8.2)

## 2011-09-24 LAB — CBC WITH DIFFERENTIAL/PLATELET
Basophil %: 0.4 %
Eosinophil %: 1.2 %
HCT: 32.7 % — ABNORMAL LOW (ref 35.0–47.0)
MCH: 27.4 pg (ref 26.0–34.0)
MCV: 85 fL (ref 80–100)
Monocyte %: 4 %
Neutrophil %: 90.2 %
Platelet: 395 10*3/uL (ref 150–440)
RBC: 3.85 10*6/uL (ref 3.80–5.20)
WBC: 25.8 10*3/uL — ABNORMAL HIGH (ref 3.6–11.0)

## 2011-09-24 LAB — TROPONIN I
Troponin-I: 0.05 ng/mL
Troponin-I: 0.1 ng/mL — ABNORMAL HIGH

## 2011-09-24 LAB — PRO B NATRIURETIC PEPTIDE: B-Type Natriuretic Peptide: 2625 pg/mL — ABNORMAL HIGH (ref 0–125)

## 2011-09-25 LAB — CBC WITH DIFFERENTIAL/PLATELET
Basophil #: 0.1 10*3/uL (ref 0.0–0.1)
Eosinophil #: 0.5 10*3/uL (ref 0.0–0.7)
Eosinophil %: 5 %
HCT: 28 % — ABNORMAL LOW (ref 35.0–47.0)
Lymphocyte #: 1.8 10*3/uL (ref 1.0–3.6)
Lymphocyte %: 17.8 %
Monocyte %: 6.7 %
Neutrophil #: 7 10*3/uL — ABNORMAL HIGH (ref 1.4–6.5)
Neutrophil %: 69.7 %
Platelet: 303 10*3/uL (ref 150–440)
RBC: 3.31 10*6/uL — ABNORMAL LOW (ref 3.80–5.20)
RDW: 17.4 % — ABNORMAL HIGH (ref 11.5–14.5)
WBC: 10.1 10*3/uL (ref 3.6–11.0)

## 2011-09-25 LAB — BASIC METABOLIC PANEL
BUN: 30 mg/dL — ABNORMAL HIGH (ref 7–18)
Chloride: 105 mmol/L (ref 98–107)
Co2: 30 mmol/L (ref 21–32)
Creatinine: 1.36 mg/dL — ABNORMAL HIGH (ref 0.60–1.30)
Osmolality: 287 (ref 275–301)
Potassium: 4.7 mmol/L (ref 3.5–5.1)

## 2011-09-27 ENCOUNTER — Ambulatory Visit: Payer: Self-pay | Admitting: Internal Medicine

## 2011-09-27 LAB — IRON AND TIBC
Iron Saturation: 15 %
Iron: 49 ug/dL — ABNORMAL LOW (ref 50–170)

## 2011-09-27 LAB — FERRITIN: Ferritin (ARMC): 46 ng/mL (ref 8–388)

## 2011-09-27 LAB — CBC WITH DIFFERENTIAL/PLATELET
Basophil #: 0.1 10*3/uL (ref 0.0–0.1)
Eosinophil #: 0.5 10*3/uL (ref 0.0–0.7)
HCT: 28.9 % — ABNORMAL LOW (ref 35.0–47.0)
Lymphocyte %: 22.6 %
MCHC: 33.4 g/dL (ref 32.0–36.0)
Monocyte #: 0.8 x10 3/mm (ref 0.2–0.9)
Neutrophil #: 6.2 10*3/uL (ref 1.4–6.5)
Neutrophil %: 63.2 %
Platelet: 319 10*3/uL (ref 150–440)
RBC: 3.41 10*6/uL — ABNORMAL LOW (ref 3.80–5.20)
RDW: 17 % — ABNORMAL HIGH (ref 11.5–14.5)

## 2011-09-27 LAB — BASIC METABOLIC PANEL
Anion Gap: 6 — ABNORMAL LOW (ref 7–16)
Chloride: 103 mmol/L (ref 98–107)
Co2: 29 mmol/L (ref 21–32)
Creatinine: 1.26 mg/dL (ref 0.60–1.30)
Potassium: 4.3 mmol/L (ref 3.5–5.1)

## 2011-10-15 ENCOUNTER — Inpatient Hospital Stay: Payer: Self-pay | Admitting: Internal Medicine

## 2011-10-15 LAB — URINALYSIS, COMPLETE
Ketone: NEGATIVE
Nitrite: POSITIVE
Ph: 6 (ref 4.5–8.0)
Protein: 100
Specific Gravity: 1.004 (ref 1.003–1.030)
WBC UR: 26 /HPF (ref 0–5)

## 2011-10-15 LAB — COMPREHENSIVE METABOLIC PANEL
Albumin: 3 g/dL — ABNORMAL LOW (ref 3.4–5.0)
Alkaline Phosphatase: 68 U/L (ref 50–136)
Anion Gap: 9 (ref 7–16)
Bilirubin,Total: 0.3 mg/dL (ref 0.2–1.0)
Calcium, Total: 9.4 mg/dL (ref 8.5–10.1)
Co2: 28 mmol/L (ref 21–32)
Glucose: 137 mg/dL — ABNORMAL HIGH (ref 65–99)
Potassium: 4 mmol/L (ref 3.5–5.1)
SGOT(AST): 13 U/L — ABNORMAL LOW (ref 15–37)
Total Protein: 7 g/dL (ref 6.4–8.2)

## 2011-10-15 LAB — CBC WITH DIFFERENTIAL/PLATELET
Basophil #: 0.1 10*3/uL (ref 0.0–0.1)
Basophil %: 0.7 %
Eosinophil %: 2.4 %
HCT: 35.4 % (ref 35.0–47.0)
HGB: 11.4 g/dL — ABNORMAL LOW (ref 12.0–16.0)
Lymphocyte %: 7.4 %
Monocyte %: 6 %
Neutrophil #: 13.7 10*3/uL — ABNORMAL HIGH (ref 1.4–6.5)
RBC: 4.23 10*6/uL (ref 3.80–5.20)
RDW: 15.3 % — ABNORMAL HIGH (ref 11.5–14.5)
WBC: 16.4 10*3/uL — ABNORMAL HIGH (ref 3.6–11.0)

## 2011-10-15 LAB — CK TOTAL AND CKMB (NOT AT ARMC)
CK, Total: 27 U/L (ref 21–215)
CK-MB: 2.3 ng/mL (ref 0.5–3.6)
CK-MB: 1.7 ng/mL (ref 0.5–3.6)

## 2011-10-15 LAB — PROTIME-INR: INR: 1

## 2011-10-15 LAB — TROPONIN I
Troponin-I: <0.02 ng/mL
Troponin-I: 0.03 ng/mL

## 2011-10-16 LAB — COMPREHENSIVE METABOLIC PANEL
Albumin: 2.8 g/dL — ABNORMAL LOW (ref 3.4–5.0)
Alkaline Phosphatase: 59 U/L (ref 50–136)
BUN: 20 mg/dL — ABNORMAL HIGH (ref 7–18)
Bilirubin,Total: 0.3 mg/dL (ref 0.2–1.0)
Calcium, Total: 9.4 mg/dL (ref 8.5–10.1)
Chloride: 103 mmol/L (ref 98–107)
Creatinine: 1.22 mg/dL (ref 0.60–1.30)
EGFR (African American): 53 — ABNORMAL LOW
EGFR (Non-African Amer.): 46 — ABNORMAL LOW
Glucose: 99 mg/dL (ref 65–99)
Potassium: 3.6 mmol/L (ref 3.5–5.1)
SGOT(AST): 11 U/L — ABNORMAL LOW (ref 15–37)
SGPT (ALT): 12 U/L (ref 12–78)
Sodium: 143 mmol/L (ref 136–145)

## 2011-10-16 LAB — CBC WITH DIFFERENTIAL/PLATELET
Basophil #: 0 10*3/uL (ref 0.0–0.1)
Eosinophil #: 0.4 10*3/uL (ref 0.0–0.7)
Eosinophil %: 3.7 %
HCT: 31 % — ABNORMAL LOW (ref 35.0–47.0)
Lymphocyte #: 1.6 10*3/uL (ref 1.0–3.6)
Lymphocyte %: 15.7 %
MCHC: 32.1 g/dL (ref 32.0–36.0)
MCV: 84 fL (ref 80–100)
Monocyte #: 0.8 x10 3/mm (ref 0.2–0.9)
Monocyte %: 8 %
Neutrophil %: 72.2 %
Platelet: 282 10*3/uL (ref 150–440)
RBC: 3.7 10*6/uL — ABNORMAL LOW (ref 3.80–5.20)
RDW: 15.7 % — ABNORMAL HIGH (ref 11.5–14.5)
WBC: 10.3 10*3/uL (ref 3.6–11.0)

## 2011-10-17 LAB — URINE CULTURE

## 2011-10-18 LAB — BASIC METABOLIC PANEL
Anion Gap: 10 (ref 7–16)
BUN: 30 mg/dL — ABNORMAL HIGH (ref 7–18)
Chloride: 99 mmol/L (ref 98–107)
Co2: 31 mmol/L (ref 21–32)
Osmolality: 285 (ref 275–301)

## 2011-10-18 LAB — TROPONIN I: Troponin-I: <0.02 ng/mL

## 2011-10-20 LAB — CULTURE, BLOOD (SINGLE)

## 2011-11-27 ENCOUNTER — Ambulatory Visit: Payer: Self-pay | Admitting: Internal Medicine

## 2011-12-19 ENCOUNTER — Inpatient Hospital Stay: Payer: Self-pay | Admitting: Specialist

## 2011-12-19 LAB — COMPREHENSIVE METABOLIC PANEL
Alkaline Phosphatase: 97 U/L (ref 50–136)
Anion Gap: 7 (ref 7–16)
BUN: 14 mg/dL (ref 7–18)
Bilirubin,Total: 0.5 mg/dL (ref 0.2–1.0)
Calcium, Total: 9.6 mg/dL (ref 8.5–10.1)
Chloride: 103 mmol/L (ref 98–107)
Co2: 27 mmol/L (ref 21–32)
EGFR (Non-African Amer.): 41 — ABNORMAL LOW
Osmolality: 281 (ref 275–301)
SGPT (ALT): 16 U/L (ref 12–78)
Sodium: 137 mmol/L (ref 136–145)
Total Protein: 8.8 g/dL — ABNORMAL HIGH (ref 6.4–8.2)

## 2011-12-19 LAB — CBC
MCH: 24.6 pg — ABNORMAL LOW (ref 26.0–34.0)
MCV: 80 fL (ref 80–100)
Platelet: 555 10*3/uL — ABNORMAL HIGH (ref 150–440)
RDW: 16.4 % — ABNORMAL HIGH (ref 11.5–14.5)
WBC: 25 10*3/uL — ABNORMAL HIGH (ref 3.6–11.0)

## 2011-12-19 LAB — PROTIME-INR
INR: 1.1
Prothrombin Time: 14.1 secs (ref 11.5–14.7)

## 2011-12-19 LAB — URINALYSIS, COMPLETE
Ketone: NEGATIVE
Nitrite: NEGATIVE
Ph: 8 (ref 4.5–8.0)
Protein: >=500
Specific Gravity: 1.009 (ref 1.003–1.030)

## 2011-12-19 LAB — PRO B NATRIURETIC PEPTIDE: B-Type Natriuretic Peptide: 5691 pg/mL — ABNORMAL HIGH (ref 0–125)

## 2011-12-19 LAB — CK TOTAL AND CKMB (NOT AT ARMC): CK, Total: 49 U/L (ref 21–215)

## 2011-12-19 LAB — TROPONIN I: Troponin-I: <0.02 ng/mL

## 2011-12-19 LAB — MAGNESIUM: Magnesium: 2.1 mg/dL

## 2011-12-20 LAB — BASIC METABOLIC PANEL
Calcium, Total: 9.3 mg/dL (ref 8.5–10.1)
Chloride: 99 mmol/L (ref 98–107)
Creatinine: 1.09 mg/dL (ref 0.60–1.30)
Glucose: 149 mg/dL — ABNORMAL HIGH (ref 65–99)
Osmolality: 282 (ref 275–301)
Potassium: 3.7 mmol/L (ref 3.5–5.1)

## 2011-12-20 LAB — CBC WITH DIFFERENTIAL/PLATELET
Basophil #: 0.5 10*3/uL — ABNORMAL HIGH (ref 0.0–0.1)
Basophil %: 2.2 %
Eosinophil #: 0 10*3/uL (ref 0.0–0.7)
HCT: 36.8 % (ref 35.0–47.0)
HGB: 11.8 g/dL — ABNORMAL LOW (ref 12.0–16.0)
Lymphocyte #: 1.3 10*3/uL (ref 1.0–3.6)
Lymphocyte %: 6.5 %
MCH: 25 pg — ABNORMAL LOW (ref 26.0–34.0)
MCHC: 32 g/dL (ref 32.0–36.0)
Monocyte #: 1.5 x10 3/mm — ABNORMAL HIGH (ref 0.2–0.9)
Monocyte %: 7.4 %
Neutrophil #: 16.9 10*3/uL — ABNORMAL HIGH (ref 1.4–6.5)
RBC: 4.72 10*6/uL (ref 3.80–5.20)

## 2011-12-21 LAB — CBC WITH DIFFERENTIAL/PLATELET
Basophil %: 0.3 %
Eosinophil #: 0.2 10*3/uL (ref 0.0–0.7)
Eosinophil %: 1.6 %
HCT: 35.7 % (ref 35.0–47.0)
Lymphocyte %: 11.5 %
MCH: 24.9 pg — ABNORMAL LOW (ref 26.0–34.0)
Monocyte #: 1.1 x10 3/mm — ABNORMAL HIGH (ref 0.2–0.9)
Neutrophil %: 79.3 %
Platelet: 312 10*3/uL (ref 150–440)
RBC: 4.58 10*6/uL (ref 3.80–5.20)

## 2011-12-21 LAB — BASIC METABOLIC PANEL
Calcium, Total: 9 mg/dL (ref 8.5–10.1)
Chloride: 99 mmol/L (ref 98–107)
Creatinine: 1.07 mg/dL (ref 0.60–1.30)
EGFR (Non-African Amer.): 54 — ABNORMAL LOW
Glucose: 151 mg/dL — ABNORMAL HIGH (ref 65–99)
Osmolality: 283 (ref 275–301)
Potassium: 2.7 mmol/L — ABNORMAL LOW (ref 3.5–5.1)

## 2011-12-21 LAB — POTASSIUM: Potassium: 3.1 mmol/L — ABNORMAL LOW (ref 3.5–5.1)

## 2011-12-22 LAB — BASIC METABOLIC PANEL
Anion Gap: 6 — ABNORMAL LOW (ref 7–16)
Calcium, Total: 8.7 mg/dL (ref 8.5–10.1)
Chloride: 99 mmol/L (ref 98–107)
Co2: 32 mmol/L (ref 21–32)
Creatinine: 1.24 mg/dL (ref 0.60–1.30)
EGFR (Non-African Amer.): 45 — ABNORMAL LOW
Osmolality: 281 (ref 275–301)

## 2011-12-22 LAB — MAGNESIUM: Magnesium: 2.1 mg/dL

## 2011-12-23 LAB — BASIC METABOLIC PANEL
Anion Gap: 4 — ABNORMAL LOW (ref 7–16)
Calcium, Total: 8.6 mg/dL (ref 8.5–10.1)
Chloride: 100 mmol/L (ref 98–107)
Creatinine: 1.05 mg/dL (ref 0.60–1.30)
EGFR (African American): >60
EGFR (Non-African Amer.): 55 — ABNORMAL LOW
Osmolality: 281 (ref 275–301)
Potassium: 3.4 mmol/L — ABNORMAL LOW (ref 3.5–5.1)
Sodium: 137 mmol/L (ref 136–145)

## 2011-12-23 LAB — CBC WITH DIFFERENTIAL/PLATELET
Basophil #: 0 10*3/uL (ref 0.0–0.1)
Basophil %: 0.4 %
Eosinophil %: 9.3 %
HGB: 11.8 g/dL — ABNORMAL LOW (ref 12.0–16.0)
Lymphocyte #: 1.5 10*3/uL (ref 1.0–3.6)
Lymphocyte %: 12.8 %
MCH: 25 pg — ABNORMAL LOW (ref 26.0–34.0)
MCV: 78 fL — ABNORMAL LOW (ref 80–100)
Monocyte #: 0.9 x10 3/mm (ref 0.2–0.9)
Monocyte %: 7.4 %
Neutrophil %: 70.1 %
Platelet: 319 10*3/uL (ref 150–440)
RBC: 4.72 10*6/uL (ref 3.80–5.20)
RDW: 16.7 % — ABNORMAL HIGH (ref 11.5–14.5)

## 2011-12-25 DIAGNOSIS — F411 Generalized anxiety disorder: Secondary | ICD-10-CM

## 2011-12-25 DIAGNOSIS — E119 Type 2 diabetes mellitus without complications: Secondary | ICD-10-CM

## 2011-12-25 DIAGNOSIS — F329 Major depressive disorder, single episode, unspecified: Secondary | ICD-10-CM

## 2011-12-25 DIAGNOSIS — I10 Essential (primary) hypertension: Secondary | ICD-10-CM

## 2011-12-25 DIAGNOSIS — E785 Hyperlipidemia, unspecified: Secondary | ICD-10-CM

## 2011-12-25 DIAGNOSIS — I5030 Unspecified diastolic (congestive) heart failure: Secondary | ICD-10-CM

## 2011-12-27 ENCOUNTER — Ambulatory Visit: Payer: Self-pay | Admitting: Internal Medicine

## 2012-01-01 DIAGNOSIS — E119 Type 2 diabetes mellitus without complications: Secondary | ICD-10-CM

## 2012-01-01 DIAGNOSIS — F329 Major depressive disorder, single episode, unspecified: Secondary | ICD-10-CM

## 2012-01-01 DIAGNOSIS — I5030 Unspecified diastolic (congestive) heart failure: Secondary | ICD-10-CM

## 2012-01-01 DIAGNOSIS — F411 Generalized anxiety disorder: Secondary | ICD-10-CM

## 2012-01-01 DIAGNOSIS — I10 Essential (primary) hypertension: Secondary | ICD-10-CM

## 2012-01-06 DIAGNOSIS — J449 Chronic obstructive pulmonary disease, unspecified: Secondary | ICD-10-CM

## 2012-01-06 DIAGNOSIS — J9620 Acute and chronic respiratory failure, unspecified whether with hypoxia or hypercapnia: Secondary | ICD-10-CM

## 2012-01-06 DIAGNOSIS — G4733 Obstructive sleep apnea (adult) (pediatric): Secondary | ICD-10-CM

## 2012-01-06 DIAGNOSIS — I509 Heart failure, unspecified: Secondary | ICD-10-CM

## 2012-01-11 ENCOUNTER — Telehealth: Payer: Self-pay | Admitting: Family Medicine

## 2012-01-11 NOTE — Telephone Encounter (Signed)
[facility] °Triage Call Report °Triage Record Num: [id number] Operator: Ellen Hocevar °Patient Name: Susan Graham Call Date & Time: 01/10/2012 4:47:50PM °Patient Phone: [phone] PCP: Richard Letvak °Patient Gender: Female PCP Fax : [phone] °Patient DOB: 09/25/1945 Practice Name: [facility] °Reason for Call: °Caller: Nikita/RN; PCP: Letvak , Richard (Family Practice); CB#: [phone]; Caller °requesting orders; Reports urine culture and sensitivity + for > 100,000 E-Coli reported °01/08/12. Culture shows sensitive to Cefoxitin, Nitrofurointoin, Trimeth/Sulfa. Resistant to °Ampicillin/Sul & Cipro. Patient allergic to Sulfa, Levaquin, Codeine and Risperodol. Urine °done for urinary complaints; reported lower abdominal pain 01/10/12 with relief from °Roximal BID. Vital signs: 146/78, TPR 98.1, 70, 20. FBS 205 01/08/12. On hospice. Paged °Dr Letvak for + urine culture and limited standing orders for one or more urinary tract °symptoms not previously evaluated per Urinary Symptoms guideline. Dr Letvak ordered he °will see patient 01/11/12; UA was normal. °Protocol(s) Used: Urinary Symptoms - Female °Recommended Outcome per Protocol: See Provider within 24 hours °Reason for Outcome: Has one or more urinary tract symptoms AND °has not been previously evaluated °Physician Instructions / Orders: °Care Advice: °~ Call provider if you develop flank or low back pain, fever, generally feel sick. °Increase intake of fluids. Try to drink 8 oz. (.2 liter) every hour when awake, including unsweetened cranberry juice, °unless on restricted fluids for other medical reasons. Take sips of fluid or eat ice chips if nauseated or vomiting. °~ °~ SYMPTOM / CONDITION MANAGEMENT °~ Call provider if urine is pink, red, smoky or cola colored. °01/10/2012 5:31:16PM Page 1 of 1 CAN_TriageRpt_V2 °

## 2012-01-11 NOTE — Telephone Encounter (Signed)
Urinalysis was benign No treatment indicated

## 2012-01-11 NOTE — Telephone Encounter (Signed)
[facility] Triage Call Report Triage Record Num: [id number] Operator: Geanie Berlin Patient Name: Susan Graham Call Date & Time: 01/10/2012 4:47:50PM Patient Phone: [phone] PCP: Tillman Abide Patient Gender: Female PCP Fax : [phone] Patient DOB: 1945-12-13 Practice Name[facility] Reason for Call: Caller: Nikita/RN; PCP: Tillman Abide (Family Practice); CB#: [phone]; Caller requesting orders; Reports urine culture and sensitivity + for > 100,000 E-Coli reported 01/08/12. Culture shows sensitive to Cefoxitin, Nitrofurointoin, Trimeth/Sulfa. Resistant to Ampicillin/Sul & Cipro. Patient allergic to Sulfa, Levaquin, Codeine and Risperodol. Urine done for urinary complaints; reported lower abdominal pain 01/10/12 with relief from Roximal BID. Vital signs: 146/78, TPR 98.1, 70, 20. FBS 205 01/08/12. On hospice. Paged Dr Alphonsus Sias for + urine culture and limited standing orders for one or more urinary tract symptoms not previously evaluated per Urinary Symptoms guideline. Dr Alphonsus Sias ordered he will see patient 01/11/12; UA was normal. Protocol(s) Used: Urinary Symptoms - Female Recommended Outcome per Protocol: See Provider within 24 hours Reason for Outcome: Has one or more urinary tract symptoms AND has not been previously evaluated Physician Instructions / Orders: Care Advice: ~ Call provider if you develop flank or low back pain, fever, generally feel sick. Increase intake of fluids. Try to drink 8 oz. (.2 liter) every hour when awake, including unsweetened cranberry juice, unless on restricted fluids for other medical reasons. Take sips of fluid or eat ice chips if nauseated or vomiting. ~ ~ SYMPTOM / CONDITION MANAGEMENT ~ Call provider if urine is pink, red, smoky or cola colored. 01/10/2012 5:31:16PM Page 1 of 1 CAN_TriageRpt_V2

## 2012-02-10 DIAGNOSIS — F329 Major depressive disorder, single episode, unspecified: Secondary | ICD-10-CM

## 2012-02-10 DIAGNOSIS — E119 Type 2 diabetes mellitus without complications: Secondary | ICD-10-CM

## 2012-02-10 DIAGNOSIS — I5030 Unspecified diastolic (congestive) heart failure: Secondary | ICD-10-CM

## 2012-02-10 DIAGNOSIS — E785 Hyperlipidemia, unspecified: Secondary | ICD-10-CM

## 2012-02-10 DIAGNOSIS — F3289 Other specified depressive episodes: Secondary | ICD-10-CM

## 2012-02-10 DIAGNOSIS — J9620 Acute and chronic respiratory failure, unspecified whether with hypoxia or hypercapnia: Secondary | ICD-10-CM

## 2012-02-10 DIAGNOSIS — I10 Essential (primary) hypertension: Secondary | ICD-10-CM

## 2012-03-15 DIAGNOSIS — F319 Bipolar disorder, unspecified: Secondary | ICD-10-CM

## 2012-03-15 DIAGNOSIS — J449 Chronic obstructive pulmonary disease, unspecified: Secondary | ICD-10-CM

## 2012-03-15 DIAGNOSIS — I5022 Chronic systolic (congestive) heart failure: Secondary | ICD-10-CM

## 2012-03-15 DIAGNOSIS — I10 Essential (primary) hypertension: Secondary | ICD-10-CM

## 2012-03-16 ENCOUNTER — Ambulatory Visit: Payer: Self-pay | Admitting: Family Medicine

## 2012-04-06 DIAGNOSIS — F329 Major depressive disorder, single episode, unspecified: Secondary | ICD-10-CM

## 2012-04-14 DIAGNOSIS — J9620 Acute and chronic respiratory failure, unspecified whether with hypoxia or hypercapnia: Secondary | ICD-10-CM

## 2012-04-14 DIAGNOSIS — J449 Chronic obstructive pulmonary disease, unspecified: Secondary | ICD-10-CM

## 2012-04-14 DIAGNOSIS — G4733 Obstructive sleep apnea (adult) (pediatric): Secondary | ICD-10-CM

## 2012-04-14 DIAGNOSIS — I509 Heart failure, unspecified: Secondary | ICD-10-CM

## 2012-05-20 DIAGNOSIS — E039 Hypothyroidism, unspecified: Secondary | ICD-10-CM

## 2012-05-20 DIAGNOSIS — E785 Hyperlipidemia, unspecified: Secondary | ICD-10-CM

## 2012-05-20 DIAGNOSIS — E119 Type 2 diabetes mellitus without complications: Secondary | ICD-10-CM

## 2012-05-20 DIAGNOSIS — F411 Generalized anxiety disorder: Secondary | ICD-10-CM

## 2012-05-20 DIAGNOSIS — J449 Chronic obstructive pulmonary disease, unspecified: Secondary | ICD-10-CM

## 2012-06-15 DIAGNOSIS — I509 Heart failure, unspecified: Secondary | ICD-10-CM

## 2012-06-15 DIAGNOSIS — J449 Chronic obstructive pulmonary disease, unspecified: Secondary | ICD-10-CM

## 2012-06-15 DIAGNOSIS — J9620 Acute and chronic respiratory failure, unspecified whether with hypoxia or hypercapnia: Secondary | ICD-10-CM

## 2012-06-15 DIAGNOSIS — G4733 Obstructive sleep apnea (adult) (pediatric): Secondary | ICD-10-CM

## 2012-07-01 ENCOUNTER — Telehealth: Payer: Self-pay

## 2012-07-01 NOTE — Telephone Encounter (Signed)
Patient is non compliant with diet I did just increase her lantus

## 2012-07-01 NOTE — Telephone Encounter (Signed)
Triage Record Num: [id number] Operator: Trey Paula Patient Name: Susan Graham Call Date & Time: 06/30/2012 9:57:26PM Patient Phone: [phone] PCP: Tillman Abide Patient Gender: Female PCP Fax : [phone] Patient DOB: May 24, 1945 Practice Name[facility] Reason for Call: Caller: Diana/LPN; PCP: Tillman Abide (Family Practice); CB#: [phone]; Call regarding Elevated blood sugar of 570, last checked 06/30/12 at 2130; Gave Lantus 26 units at bedtime. Patient is Alert and oriented, feels a little nauseated, no other symptoms. All emergent symptoms ruled out per "Diabetes: Control Problems" protocol with the exception of "New or increasing symptoms or glucose out of control as defined by provider or action plan AND taking medications/following therapy as prescribed." Per Facility standing orders advised for Blood Sugar >401, 12 U Humalog SQ now, recheck blood sugar in one hour and again in four hours. If still elevated, Call MD. Diana/LPN at [facility] understanding. Protocol(s) Used: Diabetes: Control Problems Recommended Outcome per Protocol: See Provider within 4 hours Override Outcome if Used in Protocol: Provide Home/Self Care RN Reason for Override Outcome: Rx Standing Orders Used. Reason for Outcome: New or increasing symptoms or glucose out of control as defined by provider or action plan AND taking medications/following therapy as prescribed Care Advice: ~ Call provider if symptoms worsen before scheduled appointment. ~ SYMPTOM / CONDITION MANAGEMENT ~ CAUTIONS ~ Check blood sugar and ketones before calling provider High Blood Sugar Treatment: - Follow action plan. - Adjust medications if instructed to do so in action plan or by provider. - Test blood sugar and ketones more often. - Record blood sugar and ketones in meter log or separate logbook, and take to all provider visits. - Drink extra water and other non-sugar fluids to prevent  dehydration when the blood sugar is high and urine

## 2012-07-01 NOTE — Telephone Encounter (Signed)
Triage Record Num: [id number] Operator: Edgar Frisk Patient Name: Susan Graham Call Date & Time: 07/01/2012 4:08:44AM Patient Phone: [phone] PCP: Tillman Abide Patient Gender: Female PCP Fax : [phone] Patient DOB: Aug 28, 1945 Practice Name[facility] Reason for Call: Caller: Elizabeth/RN; PCP: Tillman Abide (Family Practice); CB#: [phone]; Call regarding BS elevated; See previous triage , BS now 364. No symptoms Advised no new orders for now per standing practice orders Protocol(s) Used: Office Note Recommended Outcome per Protocol: Information Noted and Sent to Office Reason for Outcome: Caller information to office Care Advice: ~

## 2012-07-19 DIAGNOSIS — F319 Bipolar disorder, unspecified: Secondary | ICD-10-CM

## 2012-07-19 DIAGNOSIS — R079 Chest pain, unspecified: Secondary | ICD-10-CM

## 2012-07-19 DIAGNOSIS — E1165 Type 2 diabetes mellitus with hyperglycemia: Secondary | ICD-10-CM

## 2012-07-19 DIAGNOSIS — I509 Heart failure, unspecified: Secondary | ICD-10-CM

## 2012-07-28 DIAGNOSIS — N390 Urinary tract infection, site not specified: Secondary | ICD-10-CM

## 2012-08-04 DIAGNOSIS — L259 Unspecified contact dermatitis, unspecified cause: Secondary | ICD-10-CM

## 2012-08-26 ENCOUNTER — Ambulatory Visit: Payer: Self-pay | Admitting: Internal Medicine

## 2012-09-02 ENCOUNTER — Telehealth: Payer: Self-pay

## 2012-09-02 ENCOUNTER — Emergency Department: Payer: Self-pay | Admitting: Emergency Medicine

## 2012-09-02 LAB — URINALYSIS, COMPLETE
Bilirubin,UR: NEGATIVE
Protein: 100
Specific Gravity: 1.012 (ref 1.003–1.030)
WBC UR: 761 /HPF (ref 0–5)

## 2012-09-02 LAB — BASIC METABOLIC PANEL
Anion Gap: 6 — ABNORMAL LOW (ref 7–16)
Calcium, Total: 9.5 mg/dL (ref 8.5–10.1)
Chloride: 97 mmol/L — ABNORMAL LOW (ref 98–107)
Creatinine: 1.7 mg/dL — ABNORMAL HIGH (ref 0.60–1.30)
EGFR (African American): 36 — ABNORMAL LOW
Glucose: 342 mg/dL — ABNORMAL HIGH (ref 65–99)
Potassium: 4.3 mmol/L (ref 3.5–5.1)
Sodium: 132 mmol/L — ABNORMAL LOW (ref 136–145)

## 2012-09-02 LAB — CBC
HGB: 13.1 g/dL (ref 12.0–16.0)
MCV: 84 fL (ref 80–100)
RBC: 4.63 10*6/uL (ref 3.80–5.20)
WBC: 11.6 10*3/uL — ABNORMAL HIGH (ref 3.6–11.0)

## 2012-09-02 LAB — CK TOTAL AND CKMB (NOT AT ARMC): CK-MB: 0.6 ng/mL (ref 0.5–3.6)

## 2012-09-02 LAB — PRO B NATRIURETIC PEPTIDE: B-Type Natriuretic Peptide: 1489 pg/mL — ABNORMAL HIGH (ref 0–125)

## 2012-09-02 NOTE — Telephone Encounter (Signed)
[place]

## 2012-09-02 NOTE — Telephone Encounter (Signed)
Triage Record Num: [id number] Operator: Kelle Darting Patient Name: Susan Graham Call Date & Time: 09/02/2012 12:50:53AM Patient Phone: [phone] PCP: Tillman Abide Patient Gender: Female PCP Fax : [phone] Patient DOB: Dec 26, 1945 Practice Name[facility] Reason for Call: Caller: Elizabeth/RN with [facility]; PCP: Tillman Abide [facility]); CB#: [phone]; Call regarding Chest Pain; Afebrile; Onset: 09/02/12 at 0015; Sx notes: Pt. called staff to room complaining of chest pain and SOB, requesting to go to the hospital; Vitals: Blood pressure: 155/82; Heart Rate: 87; Temp. 98.2; Respirations: 24; Code Status: DNR; Guideline used: Chest Pain; Disposition: Activate EMS due to chest pain lasting 5 or more minutes and SOB; Staff has called Hospice but has not received a call back; Pt. will be sent to ED. Protocol(s) Used: Chest Pain Recommended Outcome per Protocol: Activate EMS 911 Reason for Outcome: Pressure, fullness, squeezing sensation or pain anywhere in the chest lasting 5 or more minutes now or within the last hour. Pain is NOT associated with taking a deep breath or a productive cough, movement, or touch to a localized area on the chest. Care Advice: ~

## 2012-09-03 NOTE — Telephone Encounter (Signed)
She did get sent to the ER but then returned Apparently just had a panic attack

## 2012-09-05 ENCOUNTER — Telehealth: Payer: Self-pay | Admitting: Family Medicine

## 2012-09-05 NOTE — Telephone Encounter (Signed)
[facility] Triage Call Report Triage Record Num: [id number] Operator: Kelle Darting Patient Name: Susan Graham Call Date & Time: 09/02/2012 12:50:53AM Patient Phone: [phone] PCP: Tillman Abide Patient Gender: Female PCP Fax : [phone] Patient DOB: July 16, 1945 [facility] Name[facility] Reason for Call: Caller: Elizabeth/RN with [facility]; PCP: Tillman Abide [facility]); CB#: [phone]; Call regarding Chest Pain; Afebrile; Onset: 09/02/12 at 0015; Sx notes: Pt. called staff to room complaining of chest pain and SOB, requesting to go to the hospital; Vitals: Blood pressure: 155/82; Heart Rate: 87; Temp. 98.2; Respirations: 24; Code Status: DNR; Guideline used: Chest Pain; Disposition: Activate EMS due to chest pain lasting 5 or more minutes and SOB; Staff has called Hospice but has not received a call back; Pt. will be sent to ED. Protocol(s) Used: Chest Pain Recommended Outcome per Protocol: Activate EMS 911 Reason for Outcome: Pressure, fullness, squeezing sensation or pain anywhere in the chest lasting 5 or more minutes now or within the last hour. Pain is NOT associated with taking a deep breath or a productive cough, movement, or touch to a localized area on the chest. Care Advice: ~ 09/02/2012 12:58:01AM Page 1 of 1 CAN_TriageRpt_V2

## 2012-09-07 DIAGNOSIS — I5022 Chronic systolic (congestive) heart failure: Secondary | ICD-10-CM

## 2012-09-07 DIAGNOSIS — F319 Bipolar disorder, unspecified: Secondary | ICD-10-CM

## 2012-09-07 DIAGNOSIS — E1165 Type 2 diabetes mellitus with hyperglycemia: Secondary | ICD-10-CM

## 2012-09-07 DIAGNOSIS — E1129 Type 2 diabetes mellitus with other diabetic kidney complication: Secondary | ICD-10-CM

## 2012-09-07 NOTE — Telephone Encounter (Signed)
I have already reviewed the ER visit and adjusted our plan with the nurse there and hospice RN

## 2012-09-07 NOTE — Telephone Encounter (Signed)
Can we call [facility] for an update of ER visit?

## 2012-09-08 ENCOUNTER — Emergency Department: Payer: Self-pay | Admitting: Emergency Medicine

## 2012-09-08 LAB — CBC
HCT: 33.9 % — ABNORMAL LOW (ref 35.0–47.0)
HGB: 11.4 g/dL — ABNORMAL LOW (ref 12.0–16.0)
MCH: 28.7 pg (ref 26.0–34.0)
MCHC: 33.7 g/dL (ref 32.0–36.0)
MCV: 85 fL (ref 80–100)
Platelet: 289 10*3/uL (ref 150–440)
RBC: 3.99 10*6/uL (ref 3.80–5.20)

## 2012-09-08 LAB — BASIC METABOLIC PANEL
Anion Gap: 3 — ABNORMAL LOW (ref 7–16)
Chloride: 95 mmol/L — ABNORMAL LOW (ref 98–107)
Creatinine: 1.96 mg/dL — ABNORMAL HIGH (ref 0.60–1.30)
Glucose: 355 mg/dL — ABNORMAL HIGH (ref 65–99)
Osmolality: 283 (ref 275–301)
Potassium: 5.9 mmol/L — ABNORMAL HIGH (ref 3.5–5.1)
Sodium: 128 mmol/L — ABNORMAL LOW (ref 136–145)

## 2012-09-09 DIAGNOSIS — I5030 Unspecified diastolic (congestive) heart failure: Secondary | ICD-10-CM

## 2012-09-09 DIAGNOSIS — J449 Chronic obstructive pulmonary disease, unspecified: Secondary | ICD-10-CM

## 2012-09-09 DIAGNOSIS — F329 Major depressive disorder, single episode, unspecified: Secondary | ICD-10-CM

## 2012-09-09 DIAGNOSIS — E119 Type 2 diabetes mellitus without complications: Secondary | ICD-10-CM

## 2012-09-26 ENCOUNTER — Ambulatory Visit: Payer: Self-pay | Admitting: Internal Medicine

## 2012-09-29 DIAGNOSIS — F409 Phobic anxiety disorder, unspecified: Secondary | ICD-10-CM

## 2012-09-29 DIAGNOSIS — F319 Bipolar disorder, unspecified: Secondary | ICD-10-CM

## 2012-10-06 DIAGNOSIS — G4733 Obstructive sleep apnea (adult) (pediatric): Secondary | ICD-10-CM

## 2012-10-06 DIAGNOSIS — J449 Chronic obstructive pulmonary disease, unspecified: Secondary | ICD-10-CM

## 2012-10-06 DIAGNOSIS — J9620 Acute and chronic respiratory failure, unspecified whether with hypoxia or hypercapnia: Secondary | ICD-10-CM

## 2012-10-06 DIAGNOSIS — I509 Heart failure, unspecified: Secondary | ICD-10-CM

## 2012-11-02 DIAGNOSIS — F329 Major depressive disorder, single episode, unspecified: Secondary | ICD-10-CM

## 2012-11-02 DIAGNOSIS — I5030 Unspecified diastolic (congestive) heart failure: Secondary | ICD-10-CM

## 2012-11-02 DIAGNOSIS — I10 Essential (primary) hypertension: Secondary | ICD-10-CM

## 2012-11-02 DIAGNOSIS — F411 Generalized anxiety disorder: Secondary | ICD-10-CM

## 2012-11-02 DIAGNOSIS — E119 Type 2 diabetes mellitus without complications: Secondary | ICD-10-CM

## 2012-11-02 DIAGNOSIS — J449 Chronic obstructive pulmonary disease, unspecified: Secondary | ICD-10-CM

## 2012-11-23 DIAGNOSIS — F3011 Manic episode without psychotic symptoms, mild: Secondary | ICD-10-CM

## 2012-12-02 DIAGNOSIS — J449 Chronic obstructive pulmonary disease, unspecified: Secondary | ICD-10-CM

## 2012-12-02 DIAGNOSIS — G4733 Obstructive sleep apnea (adult) (pediatric): Secondary | ICD-10-CM

## 2012-12-02 DIAGNOSIS — I509 Heart failure, unspecified: Secondary | ICD-10-CM

## 2012-12-02 DIAGNOSIS — J9620 Acute and chronic respiratory failure, unspecified whether with hypoxia or hypercapnia: Secondary | ICD-10-CM

## 2013-01-17 DIAGNOSIS — E1165 Type 2 diabetes mellitus with hyperglycemia: Secondary | ICD-10-CM

## 2013-01-17 DIAGNOSIS — I5022 Chronic systolic (congestive) heart failure: Secondary | ICD-10-CM

## 2013-01-17 DIAGNOSIS — F319 Bipolar disorder, unspecified: Secondary | ICD-10-CM

## 2013-01-17 DIAGNOSIS — J449 Chronic obstructive pulmonary disease, unspecified: Secondary | ICD-10-CM

## 2013-02-13 DIAGNOSIS — Z1619 Resistance to other specified beta lactam antibiotics: Secondary | ICD-10-CM

## 2013-02-13 DIAGNOSIS — N3090 Cystitis, unspecified without hematuria: Secondary | ICD-10-CM

## 2013-02-25 ENCOUNTER — Emergency Department: Payer: Self-pay | Admitting: Emergency Medicine

## 2013-02-25 LAB — COMPREHENSIVE METABOLIC PANEL
ALK PHOS: 76 U/L
AST: 9 U/L — AB (ref 15–37)
Albumin: 3.1 g/dL — ABNORMAL LOW (ref 3.4–5.0)
Anion Gap: 7 (ref 7–16)
BUN: 37 mg/dL — AB (ref 7–18)
Bilirubin,Total: 0.2 mg/dL (ref 0.2–1.0)
CALCIUM: 9.3 mg/dL (ref 8.5–10.1)
CHLORIDE: 95 mmol/L — AB (ref 98–107)
CO2: 33 mmol/L — AB (ref 21–32)
CREATININE: 1.81 mg/dL — AB (ref 0.60–1.30)
EGFR (African American): 33 — ABNORMAL LOW
GFR CALC NON AF AMER: 28 — AB
GLUCOSE: 254 mg/dL — AB (ref 65–99)
Osmolality: 287 (ref 275–301)
POTASSIUM: 4.5 mmol/L (ref 3.5–5.1)
SGPT (ALT): 17 U/L (ref 12–78)
Sodium: 135 mmol/L — ABNORMAL LOW (ref 136–145)
TOTAL PROTEIN: 7.9 g/dL (ref 6.4–8.2)

## 2013-02-25 LAB — URINALYSIS, COMPLETE
BILIRUBIN, UR: NEGATIVE
BLOOD: NEGATIVE
GLUCOSE, UR: NEGATIVE mg/dL (ref 0–75)
Ketone: NEGATIVE
Nitrite: NEGATIVE
PH: 6 (ref 4.5–8.0)
Protein: 100
RBC,UR: 11 /HPF (ref 0–5)
Specific Gravity: 1.009 (ref 1.003–1.030)
Squamous Epithelial: 15

## 2013-02-25 LAB — CBC WITH DIFFERENTIAL/PLATELET
Basophil #: 0.1 10*3/uL (ref 0.0–0.1)
Basophil %: 0.7 %
Eosinophil #: 1.5 10*3/uL — ABNORMAL HIGH (ref 0.0–0.7)
Eosinophil %: 9.9 %
HCT: 36.8 % (ref 35.0–47.0)
HGB: 12.2 g/dL (ref 12.0–16.0)
LYMPHS ABS: 2 10*3/uL (ref 1.0–3.6)
Lymphocyte %: 13.2 %
MCH: 28.8 pg (ref 26.0–34.0)
MCHC: 33.1 g/dL (ref 32.0–36.0)
MCV: 87 fL (ref 80–100)
MONO ABS: 1 x10 3/mm — AB (ref 0.2–0.9)
Monocyte %: 6.9 %
NEUTROS ABS: 10.6 10*3/uL — AB (ref 1.4–6.5)
Neutrophil %: 69.3 %
Platelet: 297 10*3/uL (ref 150–440)
RBC: 4.23 10*6/uL (ref 3.80–5.20)
RDW: 13.5 % (ref 11.5–14.5)
WBC: 15.2 10*3/uL — ABNORMAL HIGH (ref 3.6–11.0)

## 2013-02-25 LAB — VALPROIC ACID LEVEL: Valproic Acid: 21 ug/mL — ABNORMAL LOW

## 2013-02-27 LAB — URINE CULTURE

## 2013-03-15 DIAGNOSIS — I509 Heart failure, unspecified: Secondary | ICD-10-CM

## 2013-03-15 DIAGNOSIS — F319 Bipolar disorder, unspecified: Secondary | ICD-10-CM

## 2013-03-15 DIAGNOSIS — I10 Essential (primary) hypertension: Secondary | ICD-10-CM

## 2013-03-15 DIAGNOSIS — J449 Chronic obstructive pulmonary disease, unspecified: Secondary | ICD-10-CM

## 2013-03-15 DIAGNOSIS — E1165 Type 2 diabetes mellitus with hyperglycemia: Secondary | ICD-10-CM

## 2013-03-15 DIAGNOSIS — IMO0001 Reserved for inherently not codable concepts without codable children: Secondary | ICD-10-CM

## 2013-03-31 DIAGNOSIS — I509 Heart failure, unspecified: Secondary | ICD-10-CM

## 2013-03-31 DIAGNOSIS — J9620 Acute and chronic respiratory failure, unspecified whether with hypoxia or hypercapnia: Secondary | ICD-10-CM

## 2013-03-31 DIAGNOSIS — G4733 Obstructive sleep apnea (adult) (pediatric): Secondary | ICD-10-CM

## 2013-03-31 DIAGNOSIS — J449 Chronic obstructive pulmonary disease, unspecified: Secondary | ICD-10-CM

## 2013-04-21 DIAGNOSIS — J449 Chronic obstructive pulmonary disease, unspecified: Secondary | ICD-10-CM

## 2013-04-21 DIAGNOSIS — I5030 Unspecified diastolic (congestive) heart failure: Secondary | ICD-10-CM

## 2013-04-21 DIAGNOSIS — F411 Generalized anxiety disorder: Secondary | ICD-10-CM

## 2013-05-04 DIAGNOSIS — I10 Essential (primary) hypertension: Secondary | ICD-10-CM

## 2013-05-04 DIAGNOSIS — F319 Bipolar disorder, unspecified: Secondary | ICD-10-CM

## 2013-05-04 DIAGNOSIS — J4489 Other specified chronic obstructive pulmonary disease: Secondary | ICD-10-CM

## 2013-05-04 DIAGNOSIS — J449 Chronic obstructive pulmonary disease, unspecified: Secondary | ICD-10-CM

## 2013-07-11 DIAGNOSIS — I509 Heart failure, unspecified: Secondary | ICD-10-CM

## 2013-07-11 DIAGNOSIS — I10 Essential (primary) hypertension: Secondary | ICD-10-CM

## 2013-07-11 DIAGNOSIS — F319 Bipolar disorder, unspecified: Secondary | ICD-10-CM

## 2013-07-11 DIAGNOSIS — IMO0001 Reserved for inherently not codable concepts without codable children: Secondary | ICD-10-CM

## 2013-07-11 DIAGNOSIS — E1165 Type 2 diabetes mellitus with hyperglycemia: Secondary | ICD-10-CM

## 2013-07-11 DIAGNOSIS — M300 Polyarteritis nodosa: Secondary | ICD-10-CM

## 2013-07-11 DIAGNOSIS — R259 Unspecified abnormal involuntary movements: Secondary | ICD-10-CM

## 2013-08-03 DIAGNOSIS — L0100 Impetigo, unspecified: Secondary | ICD-10-CM

## 2013-08-17 DIAGNOSIS — K1370 Unspecified lesions of oral mucosa: Secondary | ICD-10-CM

## 2013-08-29 IMAGING — CT CT CHEST W/O CM
1 series · 15 of 33 positions shown, 19 images · non-contrast
Comparison: None

REASON FOR EXAM: acute resp failure
COMMENTS:

PROCEDURE:     CT  - CT CHEST WITHOUT CONTRAST  - September 10, 2011  [DATE]
RESULT:     Indication: Sepsis, respiratory failure
TECHNIQUE: Multiple axial images of the chest are obtained without
intravenous contrast.

[Series 2: soft tissue · axial · 0.81mm/px · z∈[-374,-89]mm · 15 of 113 slices shown, 19 images]
[im 9/113  mediastinal]
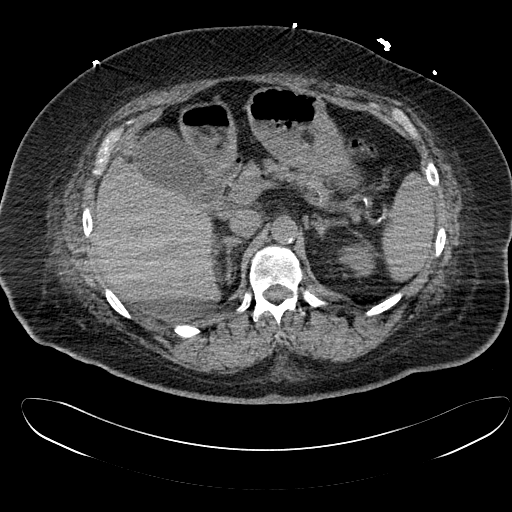
[im 9/113  lung]
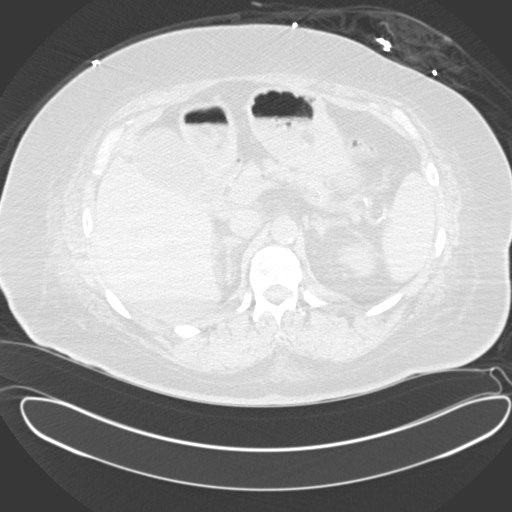
[im 17/113  lung]
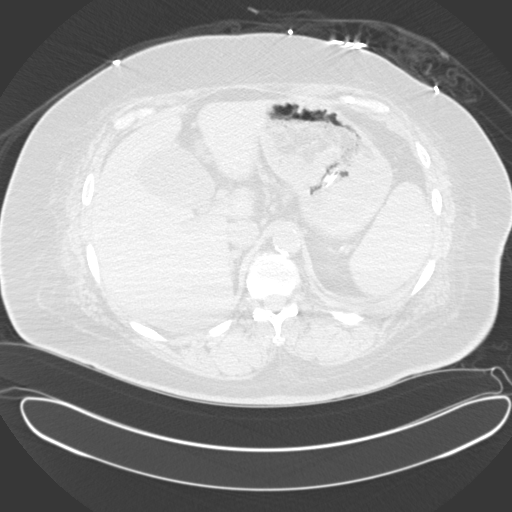
[im 23/113  lung]
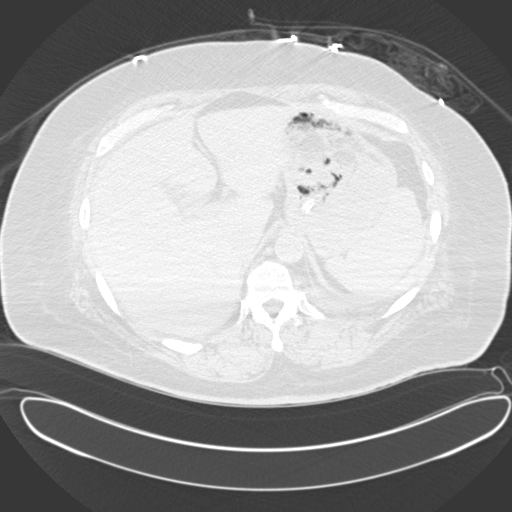
[im 30/113  lung]
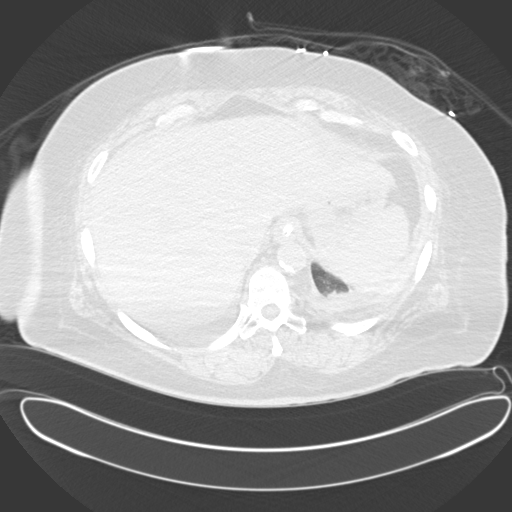
[im 38/113  mediastinal]
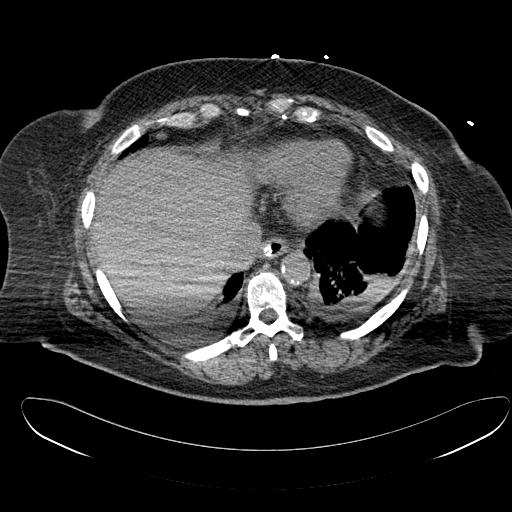
[im 38/113  lung]
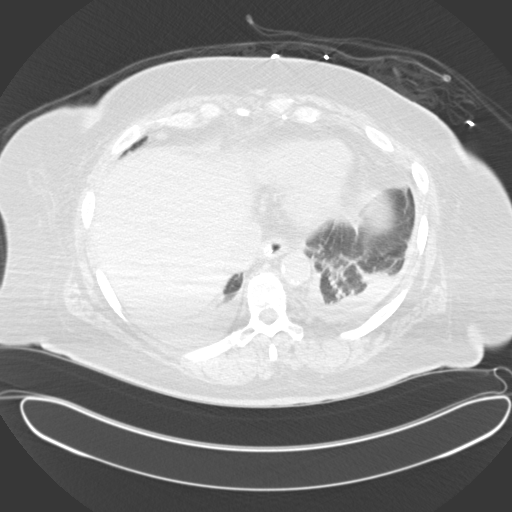
[im 45/113  lung]
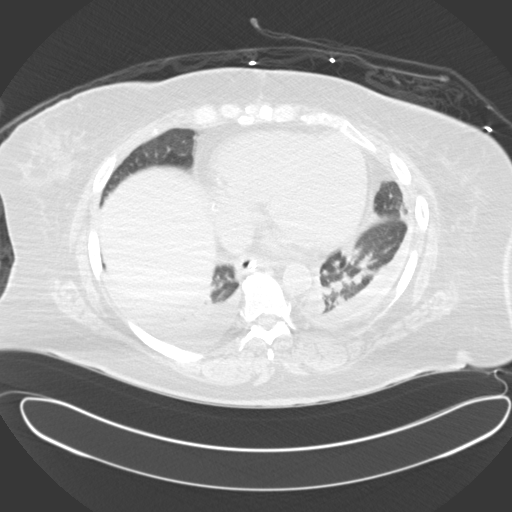
[im 50/113  lung]
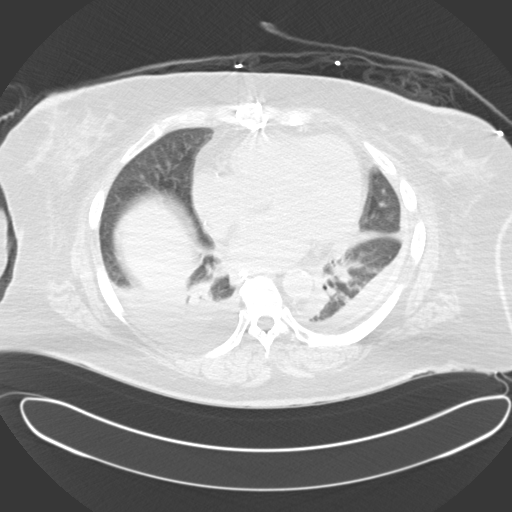
[im 59/113  lung]
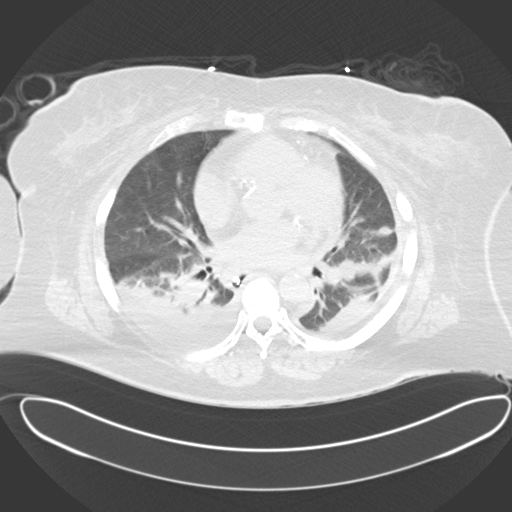
[im 63/113  mediastinal]
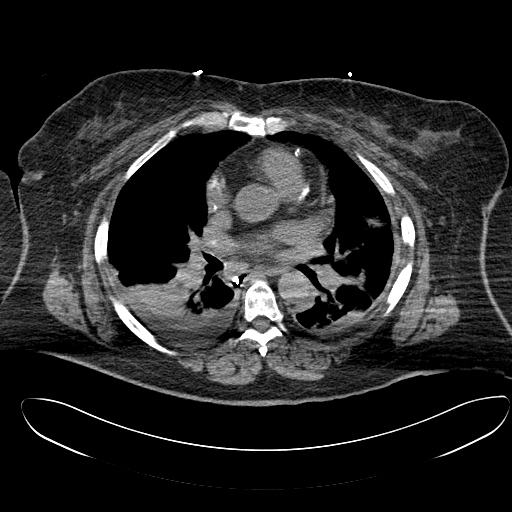
[im 63/113  lung]
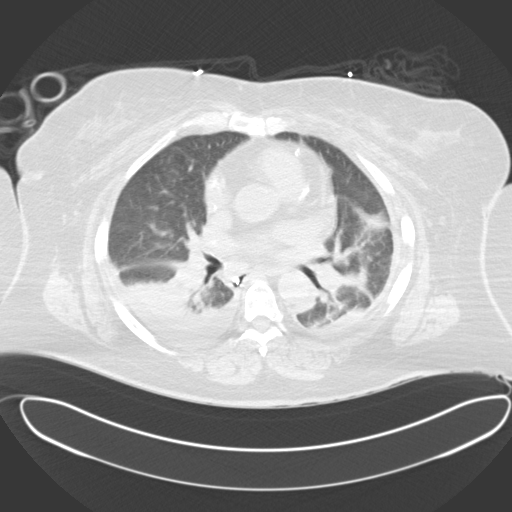
[im 68/113  lung]
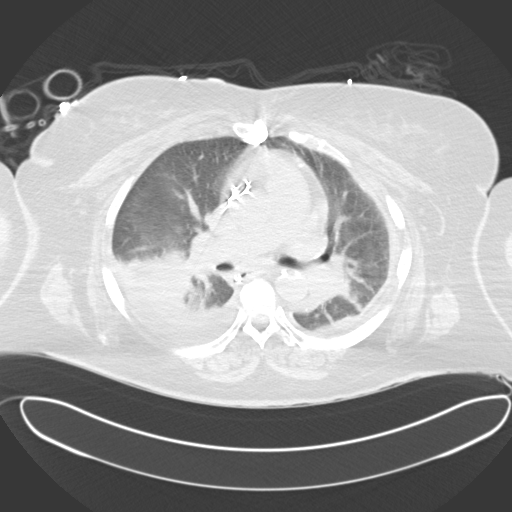
[im 75/113  lung]
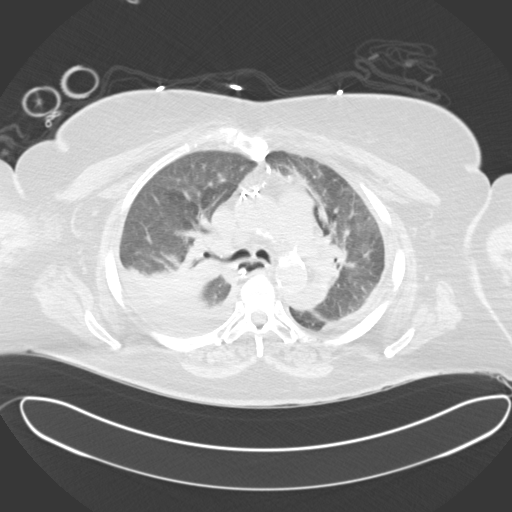
[im 83/113  lung]
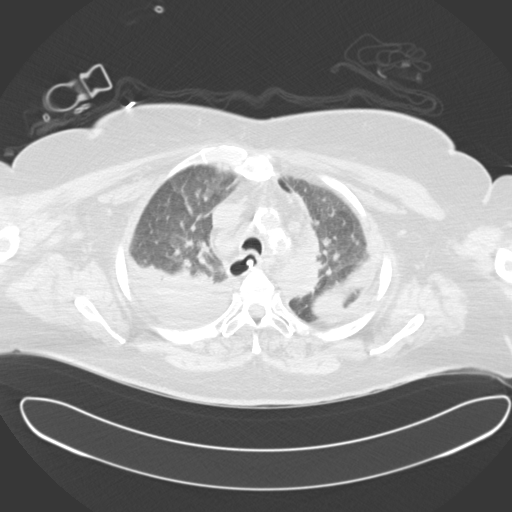
[im 90/113  mediastinal]
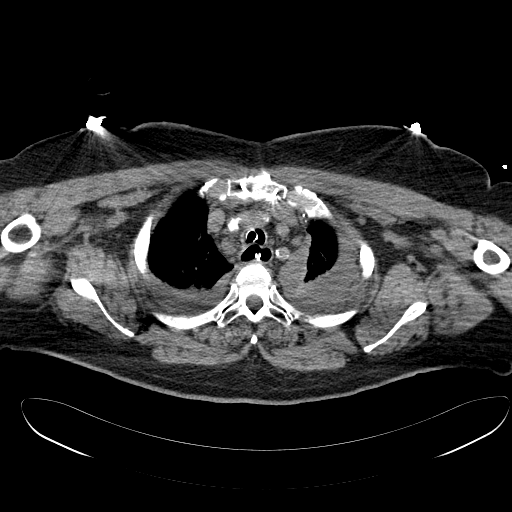
[im 90/113  lung]
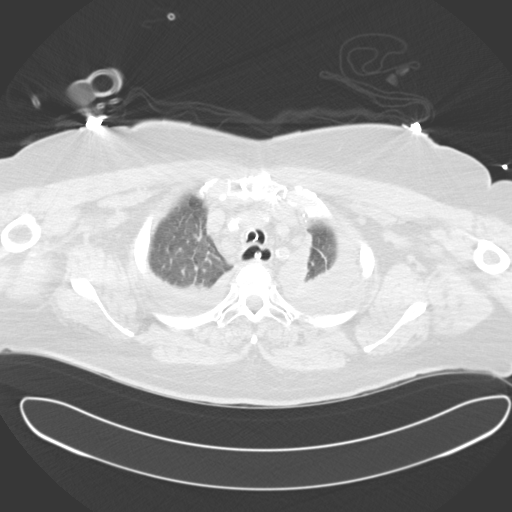
[im 96/113  lung]
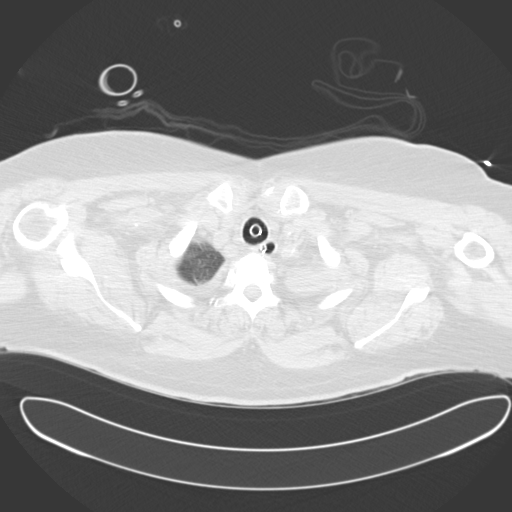
[im 104/113  lung]
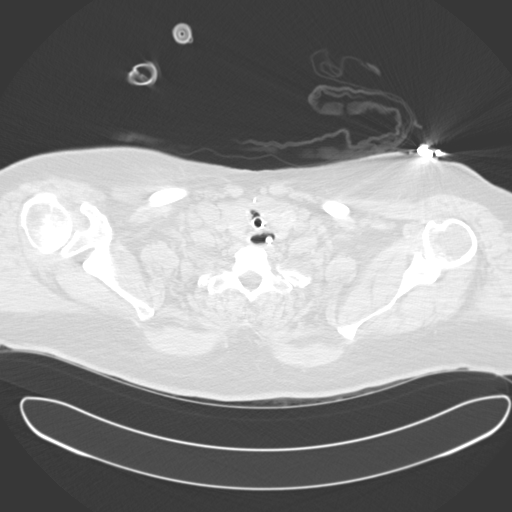

[15 of 33 positions shown; findings below may reference images not displayed]

FINDINGS: The there is an endotracheal tube with the tip 3 cm above the carina. There
is a small right pleural effusion. There is a small loculated left pleural
effusion. There is bilateral lower lobe airspace disease which may represent
atelectasis versus pneumonia. There is no pneumothorax.

There is mild mediastinal lymphadenopathy which may be reactive given the
pulmonary findings.

The heart size is normal. There is no pericardial effusion. The thoracic
aorta is normal in caliber.  There is evidence of prior CABG.

Review of bone windows demonstrates no focal lytic or sclerotic lesions.

Limited noncontrast images of the upper abdomen were obtained. The adrenal
glands appear normal. The remainder of the visualized abdominal organs are
unremarkable.
IMPRESSION: 1. There is a small right pleural effusion. There is a small loculated left
pleural effusion. There is bilateral lower lobe airspace disease which may
represent atelectasis versus pneumonia.

[REDACTED]

## 2013-08-29 IMAGING — CT CT HEAD WITHOUT CONTRAST
2 series · 15 of 30 positions shown, 19 images · non-contrast
Comparison: none

REASON FOR EXAM: syncope
COMMENTS:

PROCEDURE:     CT  - CT HEAD WITHOUT CONTRAST  - September 10, 2011  [DATE]
RESULT:     Comparison:  08/29/2011, 08/02/2011
TECHNIQUE: Multiple axial images from the foramen magnum to the vertex were
obtained without IV contrast.

[Series 2: without · axial · non-contrast · 0.42mm/px · z∈[-128,-3]mm · 13 of 31 slices shown, 17 images]
[im 3/31  brain]
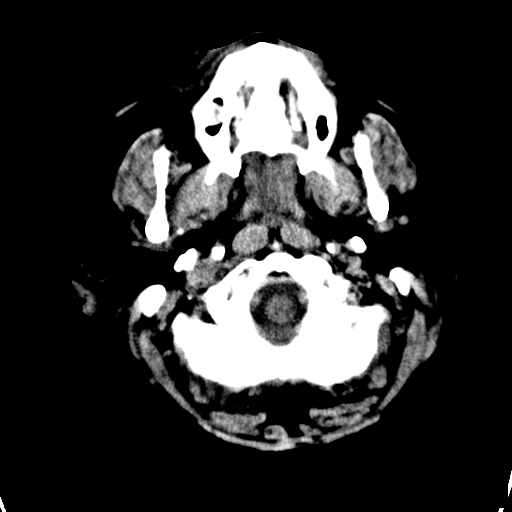
[im 3/31  bone]
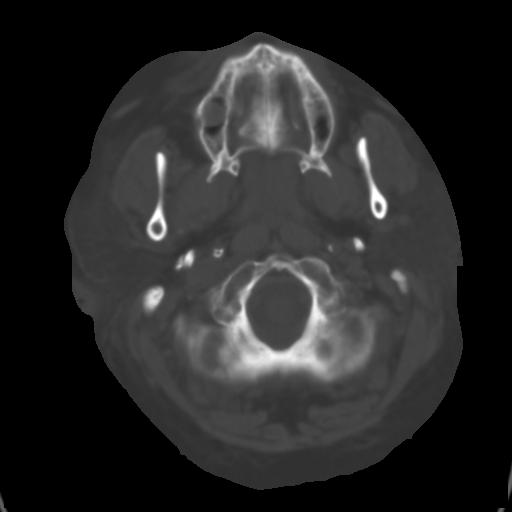
[im 5/31  brain]
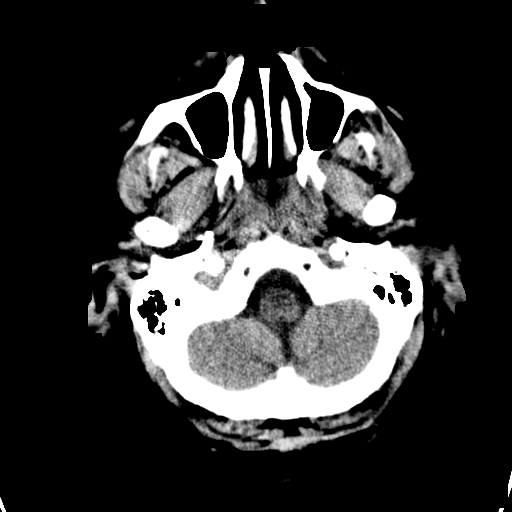
[im 7/31  brain]
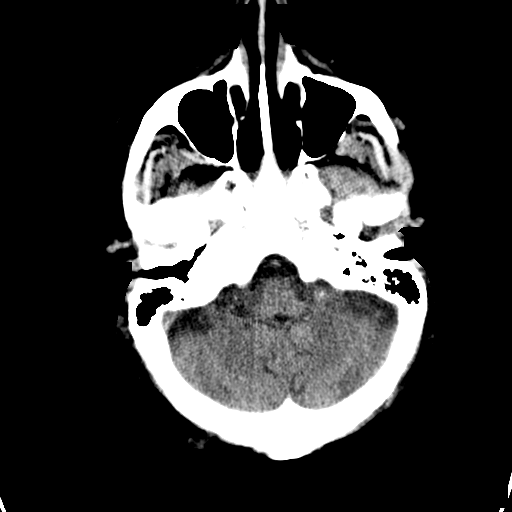
[im 9/31  brain]
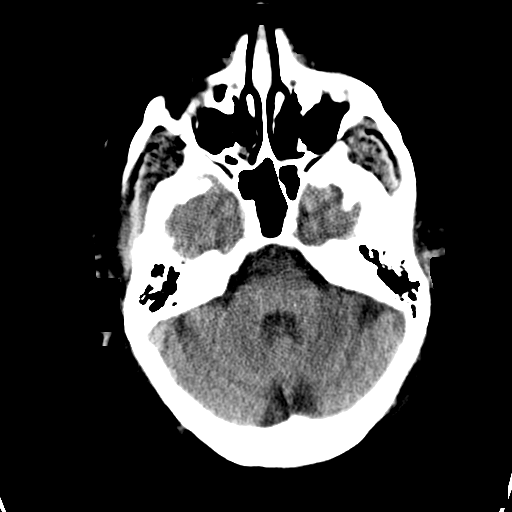
[im 11/31  brain]
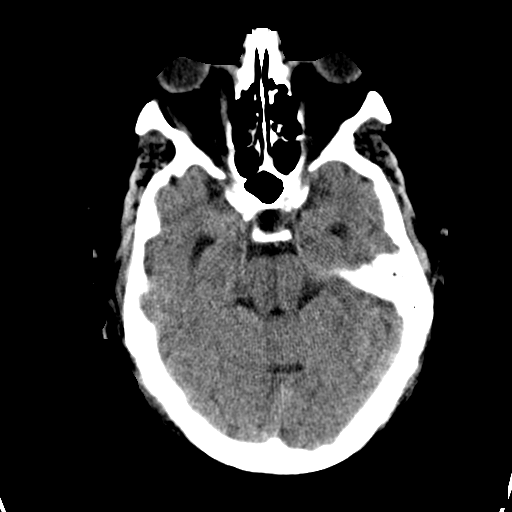
[im 11/31  bone]
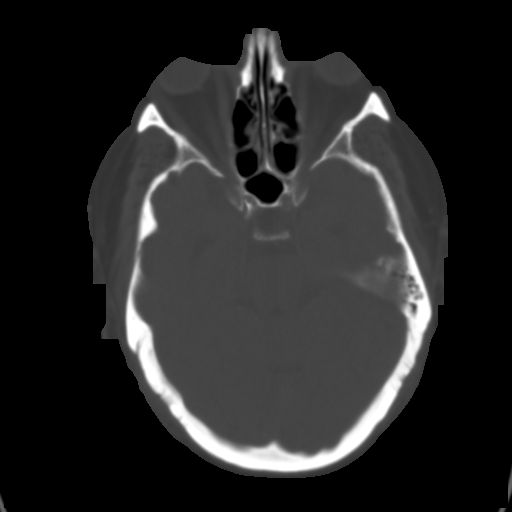
[im 13/31  brain]
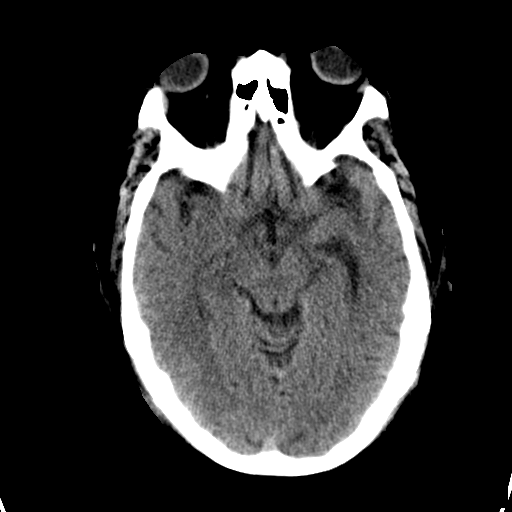
[im 16/31  brain]
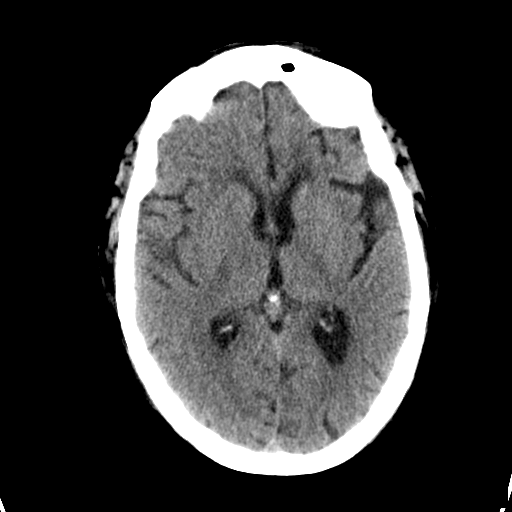
[im 18/31  brain]
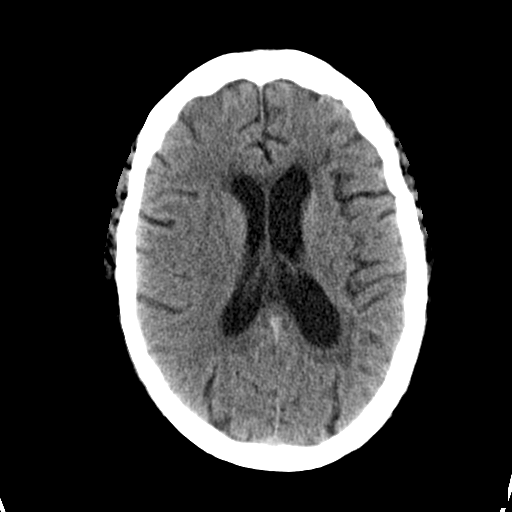
[im 20/31  brain]
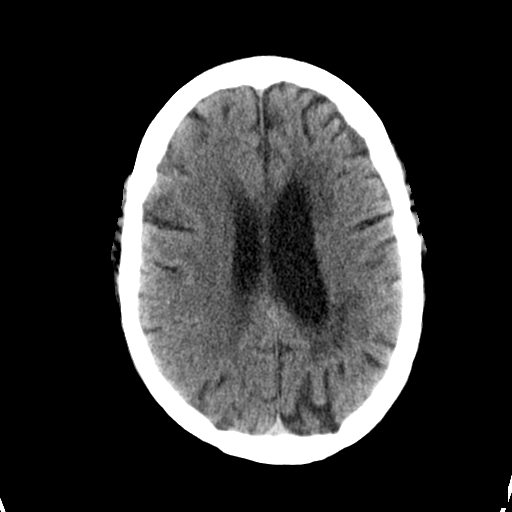
[im 20/31  bone]
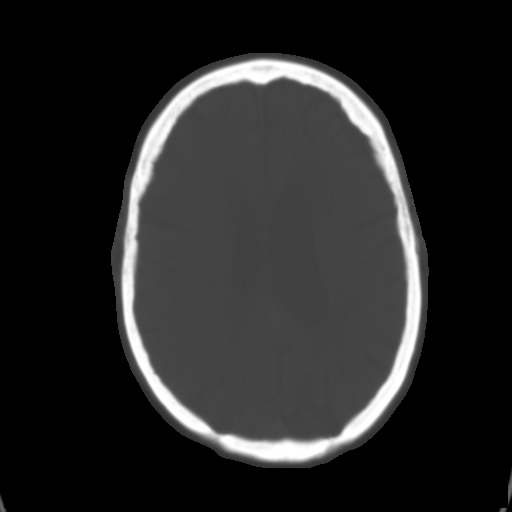
[im 22/31  brain]
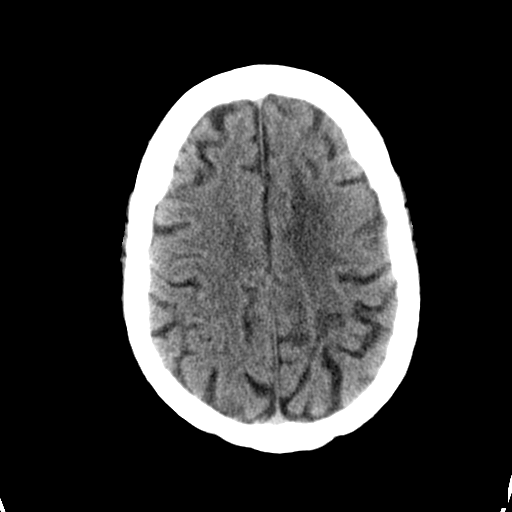
[im 24/31  brain]
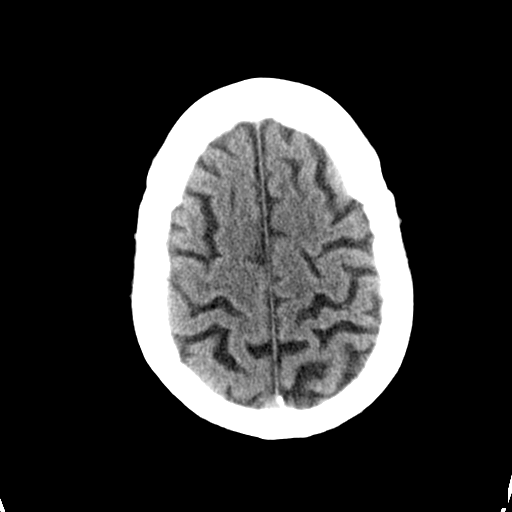
[im 26/31  brain]
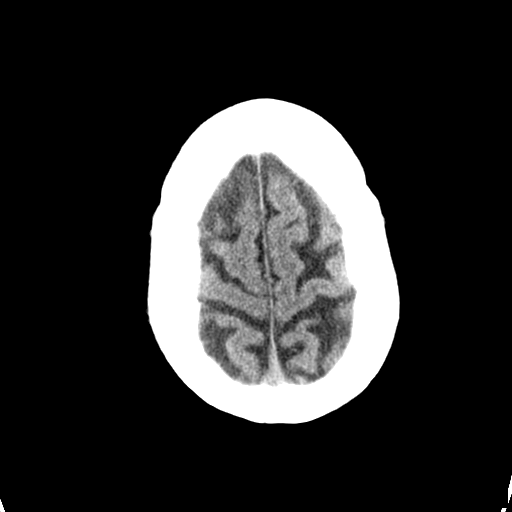
[im 28/31  brain]
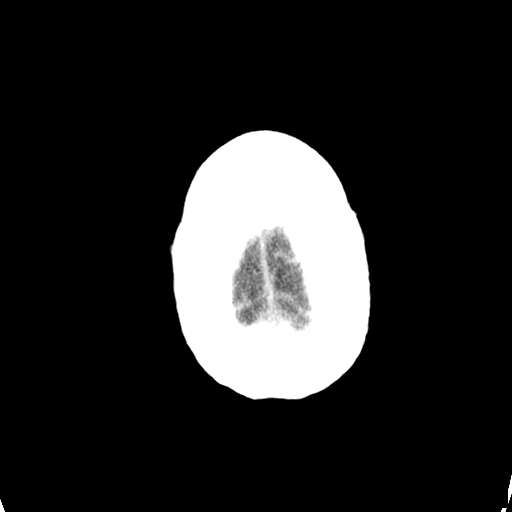
[im 28/31  bone]
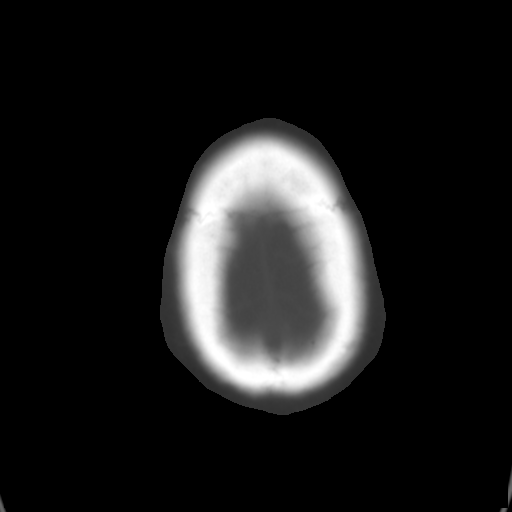

[Series 3: bone · axial · 0.42mm/px · z∈[-128,-108]mm · 2 of 31 slices shown]
[im 3/31  bone]
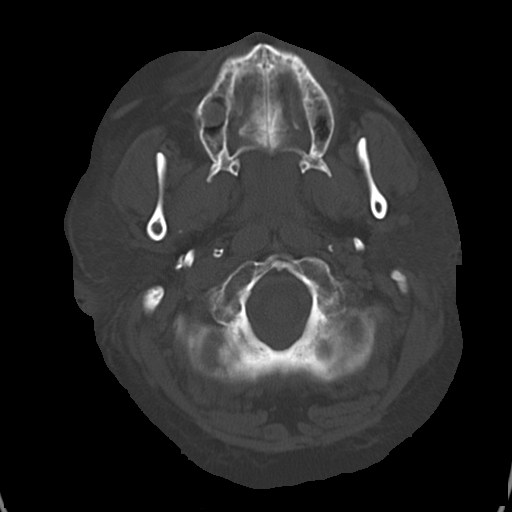
[im 7/31  bone]
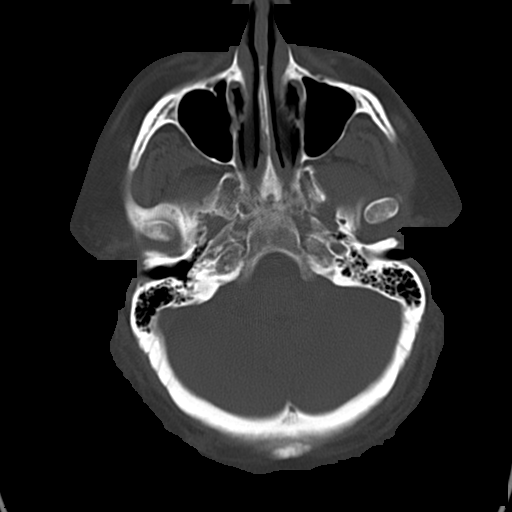

[15 of 30 positions shown; findings below may reference images not displayed]

FINDINGS: There is no evidence of mass effect, midline shift, or extra-axial fluid
collections.  There is no evidence of a space-occupying lesion or
intracranial hemorrhage. There is no evidence of a cortical-based area of
acute infarction. There is generalized cerebral atrophy. There is
periventricular white matter low attenuation likely secondary to
microangiopathy.

The ventricles and sulci are appropriate for the patient's age. The basal
cisterns are patent.

Visualized portions of the orbits are unremarkable. The visualized portions
of the paranasal sinuses and mastoid air cells are unremarkable.
Cerebrovascular atherosclerotic calcifications are noted.

The osseous structures are unremarkable.
IMPRESSION: No acute intracranial process.

CT can underestimate ischemia in the first 24 hours after the event. If
there is clinical concern for an acute infarct, a followup MRI or repeat CT
scan in 24 hours may provide additional information.

[REDACTED]

## 2013-08-29 IMAGING — CR DG CHEST 1V PORT
1 series · 1 of 1 positions shown · non-contrast
Comparison: none

REASON FOR EXAM: respiratory distress
COMMENTS:

[portable]
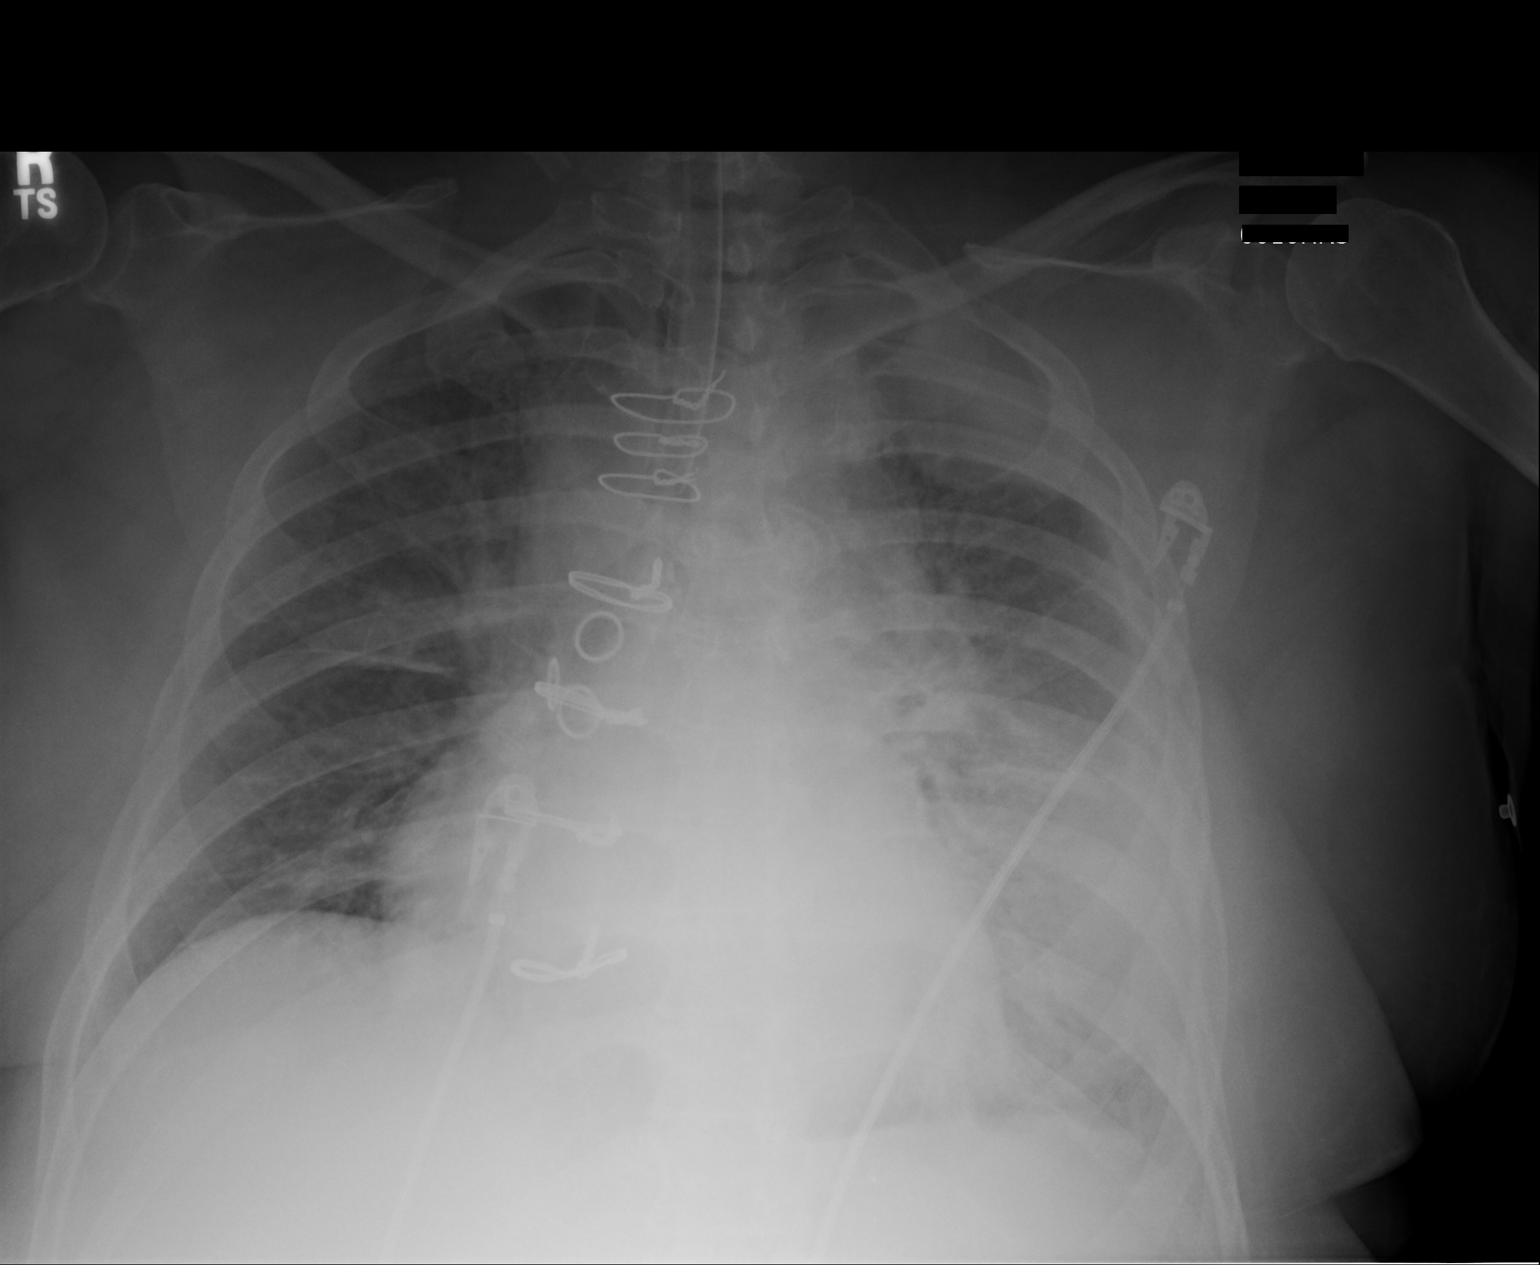

[1 of 1 positions shown; findings below may reference images not displayed]

PROCEDURE:     DXR - DXR PORTABLE CHEST SINGLE VIEW  - September 10, 2011 [DATE]

RESULT:     Comparison is made to a study 02 August, 2011.

The lungs are adequately inflated. Increased interstitial density is noted
in both lungs but most conspicuously on the left. Confluent density in the
left pulmonary apex is now present. An endotracheal tube is present whose
tip lies approximately 2 cm above the carina. The cardiac silhouette is
mildly enlarged. The pulmonary vascularity is indistinct.
IMPRESSION: Since the earlier study the appearance of the pulmonary
interstitium has worsened. There is likely pneumonia present. The patient
has undergone interval intubation.  Continued followup films are
recommended.

[REDACTED]

## 2013-09-14 DIAGNOSIS — M159 Polyosteoarthritis, unspecified: Secondary | ICD-10-CM

## 2013-09-14 DIAGNOSIS — J449 Chronic obstructive pulmonary disease, unspecified: Secondary | ICD-10-CM

## 2013-09-14 DIAGNOSIS — F319 Bipolar disorder, unspecified: Secondary | ICD-10-CM

## 2013-09-14 DIAGNOSIS — I5032 Chronic diastolic (congestive) heart failure: Secondary | ICD-10-CM

## 2013-09-14 DIAGNOSIS — IMO0001 Reserved for inherently not codable concepts without codable children: Secondary | ICD-10-CM

## 2013-09-14 DIAGNOSIS — E1165 Type 2 diabetes mellitus with hyperglycemia: Secondary | ICD-10-CM

## 2013-09-14 DIAGNOSIS — I2510 Atherosclerotic heart disease of native coronary artery without angina pectoris: Secondary | ICD-10-CM

## 2013-09-18 DIAGNOSIS — S60229A Contusion of unspecified hand, initial encounter: Secondary | ICD-10-CM

## 2013-11-07 DIAGNOSIS — J441 Chronic obstructive pulmonary disease with (acute) exacerbation: Secondary | ICD-10-CM

## 2013-11-07 DIAGNOSIS — F3162 Bipolar disorder, current episode mixed, moderate: Secondary | ICD-10-CM

## 2013-11-07 DIAGNOSIS — I5030 Unspecified diastolic (congestive) heart failure: Secondary | ICD-10-CM

## 2013-11-07 DIAGNOSIS — E1165 Type 2 diabetes mellitus with hyperglycemia: Secondary | ICD-10-CM

## 2013-11-07 DIAGNOSIS — M1990 Unspecified osteoarthritis, unspecified site: Secondary | ICD-10-CM

## 2013-11-07 DIAGNOSIS — E039 Hypothyroidism, unspecified: Secondary | ICD-10-CM

## 2013-11-07 DIAGNOSIS — I2510 Atherosclerotic heart disease of native coronary artery without angina pectoris: Secondary | ICD-10-CM

## 2014-01-11 DIAGNOSIS — J449 Chronic obstructive pulmonary disease, unspecified: Secondary | ICD-10-CM

## 2014-01-11 DIAGNOSIS — E119 Type 2 diabetes mellitus without complications: Secondary | ICD-10-CM

## 2014-01-11 DIAGNOSIS — I5032 Chronic diastolic (congestive) heart failure: Secondary | ICD-10-CM

## 2014-01-11 DIAGNOSIS — I2510 Atherosclerotic heart disease of native coronary artery without angina pectoris: Secondary | ICD-10-CM

## 2014-01-11 DIAGNOSIS — F319 Bipolar disorder, unspecified: Secondary | ICD-10-CM

## 2014-01-15 DIAGNOSIS — L219 Seborrheic dermatitis, unspecified: Secondary | ICD-10-CM

## 2014-03-14 DIAGNOSIS — E1165 Type 2 diabetes mellitus with hyperglycemia: Secondary | ICD-10-CM

## 2014-03-14 DIAGNOSIS — F3162 Bipolar disorder, current episode mixed, moderate: Secondary | ICD-10-CM

## 2014-03-14 DIAGNOSIS — M1990 Unspecified osteoarthritis, unspecified site: Secondary | ICD-10-CM

## 2014-03-14 DIAGNOSIS — I5030 Unspecified diastolic (congestive) heart failure: Secondary | ICD-10-CM | POA: Diagnosis not present

## 2014-03-14 DIAGNOSIS — I2510 Atherosclerotic heart disease of native coronary artery without angina pectoris: Secondary | ICD-10-CM

## 2014-03-14 DIAGNOSIS — J449 Chronic obstructive pulmonary disease, unspecified: Secondary | ICD-10-CM

## 2014-03-14 DIAGNOSIS — E039 Hypothyroidism, unspecified: Secondary | ICD-10-CM

## 2014-05-15 NOTE — Consult Note (Signed)
PATIENT NAME:  Susan Graham, MCTIGUE MR#:  [id number] DATE OF BIRTH:  1946-01-01  DATE OF CONSULTATION:  12/20/2011  REFERRING PHYSICIAN:   CONSULTING PHYSICIAN:  Dionisio David, MD  INDICATION FOR CONSULTATION: Elevated troponin.   HISTORY OF PRESENT ILLNESS: This is a 69 year old white female with a past medical history of being in a nursing home and under hospice care who was brought into the hospital from a skilled nursing facility because of hypoxia and respiratory failure. She was intubated and was sent to Intensive Care Unit. I was asked to evaluate the patient because there was mildly elevated troponin. Right now she is extubated. According to her boyfriend, she all of a sudden became unresponsive and short of breath and thus 911 was called and she came into the Emergency Room.   PAST MEDICAL HISTORY:  1. Diabetes.  2. Hypertension.  3. Hyperlipidemia.  4. Status post CABG in 2008, four vessel, at [facility]. 5. History of cerebrovascular accident with right-sided weakness. 6. Hypothyroidism. 7. Congestive heart failure. 8. Chronic obstructive pulmonary disease. 9. Obstructive sleep apnea.   ALLERGIES: Codeine, Levaquin, Risperdal, and sulfa.   SOCIAL HISTORY: She used to smoke, quit recently. No alcohol abuse.   FAMILY HISTORY: Positive for coronary artery disease.   PHYSICAL EXAMINATION:   GENERAL: She is alert and oriented x1; she is alert to person.   VITALS: Blood pressure is elevated at 197/46, respirations 29, pulse 92, and saturation 95.   NECK: JVD.   LUNGS: Clear.   HEART: Regular rate and rhythm. Normal S1 and S2. 2/6 systolic murmur.   ABDOMEN: Soft, nontender. Positive bowel sounds.   EXTREMITIES: No pedal edema.   NEUROLOGIC: She appears to be alert and oriented x1.   RESULTS: EKG shows normal sinus rhythm at 94 beats per minute, left axis deviation, poor R wave progression, and old anteroseptal wall MI with residual 1 to 2 mm ST elevation in V1 to V2.    Troponin 0.45 and followup troponin is 0.42.    ASSESSMENT AND PLAN: Elevated troponin with ischemic-type EKG changes. However, the patient was intubated before, has history of coronary artery disease, but the patient has been a DNR and now for some reason still got intubated and now is extubated. Since she is a hospice patient, advise treating her medically, advise controlling blood pressure. Her above troponin may have been due to demand ischemia and do not advise doing an invasive procedure at this time; however, we will increase her enalapril to 20 mg once a day because of blood pressure being elevated. We will add nitro paste to her regimen. We will follow with you closely.   Thank you much for the referral.  ____________________________ Dionisio David, MD sak:slb D: 12/20/2011 10:30:08 ET     T: 12/20/2011 10:48:20 ET         JOB#: [id number] cc: Dionisio David, MD, <Dictator> Dionisio David MD ELECTRONICALLY SIGNED 12/22/2011 8:55

## 2014-05-15 NOTE — H&P (Signed)
PATIENT NAME:  Susan Graham, Susan Graham MR#:  [id number] DATE OF BIRTH:  05-Mar-1945  DATE OF ADMISSION:  10/15/2011  PRIMARY CARE PHYSICIAN: Nicky Pugh, MD    CHIEF COMPLAINT: Shortness of breath.   HISTORY OF PRESENT ILLNESS: The patient is a 69 year old female with a history of chronic obstructive pulmonary disease, diastolic congestive heart failure with preserved ejection fraction of 50%, coronary artery disease status post CABG, presents with acute onset of shortness of breath. The patient notes that she was in her usual state of health since her discharge on 09/04 and was visiting her daughter this evening when she just felt like she "could not breathe."  She felt hot and had severe shortness of breath when she tried to ambulate from the bed to the chair. She denies orthopnea, denies fevers, denies chest pain, denies cough. She admits to increasing bilateral lower extremity swelling but cannot tell me over what duration. She reports compliance with all her medications stating that her boyfriend, son-in-law and daughter assist her with her medications.   Upon evaluation in the Emergency Department, the patient has leukocytosis, is hypertensive and has pulmonary edema as well as pleural effusion; so she is being admitted for concern of Graham likely congestive heart failure but with concern for possible healthcare-associated pneumonia.   Of note, the patient was recently discharged 09/04. She was hospitalized 08/15 through 08/21 at that time for acute respiratory failure, urinary tract infection, and aspiration pneumonitis. She was subsequently readmitted on 08/29 for acute on chronic respiratory failure of which the etiology was multifactorial and never clearly delineated.   PAST MEDICAL HISTORY:  1. Diabetes mellitus.  2. Hypertension.  3. Hyperlipidemia.  4. Coronary artery disease, status post coronary artery bypass graft.  5. Previous cerebrovascular accident with right-sided weakness.  6. History  of hypothyroidism.  7. History of diastolic congestive heart failure.  8. Chronic obstructive pulmonary disease, oxygen dependent.  9. Obstructive sleep apnea.  10. Gastroesophageal reflux disease.  11. History of tobacco use.  12. History of restless leg syndrome.  13. Morbid obesity.  14. History of pseudotumor cerebri.  15. History of bipolar/anxiety disorder.   PAST SURGICAL HISTORY: 1. Coronary artery bypass graft.  2. Hysterectomy.  3. Left knee surgery.   ALLERGIES: Levaquin causes itching. Sulfa, reaction is unknown. Codeine, reaction is unknown. Risperdal, reaction is unknown.   CURRENT MEDICATIONS:  1. CPAP at bedtime.  2. Advair Diskus 250 mcg/50 mcg, 1 puff inhaled b.i.d. 3. Amlodipine 10 mg daily.  4. Aspirin 325 mg daily.  5. Benztropin 1 mg daily.  6. DuoNebs 0.5 mg/2.5 mg/3 mL inhaled solution, 3 mL q.i.d. as needed for shortness of breath.  7. Ferrous sulfate 325 mg, 1 tablet daily.  8. Hydralazine 25 mg, 1 tablet t.i.d. 9. Imdur Extended Release 30 mg, 1 tablet daily.  10. Lantus 30 units subcutaneously at bedtime.  11. Levothyroxine 75 mcg, 1 tablet daily.  12. Metoprolol Extended Release 100 mg, 1 tablet daily.  13. Percocet 5/325, 1 tablet every 4 hours as needed for pain.  14. Plavix 75 mg daily.  15. Pramipexole 0.5 mg, 1 tablet daily.  16. Pravastatin 20 mg, 1 tablet daily.  17. Spiriva 18 mcg inhaled daily.  18. Thiothixene 2 mg daily at bedtime.  19. Vasotec 2.5 mg daily.  20. Venlafaxine 75 mg daily.  21. Zantac 150 mg, 1 tablet daily.   SOCIAL HISTORY: She denies tobacco, alcohol, or illicit drug use. She resides at a skilled nursing facility, [facility].  FAMILY HISTORY: Notable for congestive heart failure and myocardial infarction.   REVIEW OF SYSTEMS: CONSTITUTIONAL: Denies fever, weight loss or weight gain. Admits to fatigue for one day. EYES: Denies blurred vision, double vision or eye pain. ENT: Denies tinnitus or ear pain. RESPIRATIONS:  Denies cough, wheezing, painful respirations. Admits to dyspnea, and she has a history of chronic obstructive pulmonary disease on chronic home oxygen 2 liters. CARDIOVASCULAR: Denies chest pain, palpitations. Admits to edema and dyspnea on exertion. GASTROINTESTINAL: Denies nausea, vomiting, diarrhea, abdominal pain. GENITOURINARY: Denies dysuria. INTEGUMENTARY: Denies rash. She notes a bedsore on her buttocks. MUSCULOSKELETAL: Admits to arthritis in all her joints.  NEUROLOGICAL:  Denies numbness. She has a history of CVA. PSYCHIATRIC: Admits to anxiety, depression, and bipolar disorder.   PHYSICAL EXAMINATION: VITAL SIGNS: Vital signs on presentation show temperature 96.6, pulse 67, respirations 18, blood pressure 174/58, sating 94% on 2 liters nasal cannula. At the time of my evaluation, blood pressure was 189/61, heart rate was 66, respiratory rate ranged from 13 to 22. She was saturating 93% on 2 liters nasal cannula.   GENERAL: Chronically ill-appearing obese Caucasian female resting comfortably in a stretcher. No apparent distress.   HEENT: Head: Normocephalic, atraumatic. Eyes: Extraocular muscles are intact. Pupils are equally round and reactive to light and accommodation. Anicteric sclerae. She does have some mild fullness to the periorbital area. ENT: Normal external ears and nares. Posterior oropharynx is clear. Moist mucous membranes.   CARDIOVASCULAR: Normal S1, S2. Regular rate and rhythm. No murmurs. She has 2+ pitting edema in the right lower extremity, and that goes to about the mid shin and on the left to about 1+ just above the ankle.   ABDOMEN: Soft, nontender, and nondistended. Normal bowel sounds. No hepatosplenomegaly appreciated.   SKIN: Warm and dry. Examination of the buttock area reveals perineal blanchable erythema without any open sores, does not appear cellulitic. This may be mildly hyperemic.  PSYCHIATRIC: Awake, alert and oriented. Flat affect. Judgment seems  appropriate.   LYMPHATICS: No adenopathy noted in the cervical or inguinal regions.   LABORATORY, DIAGNOSTIC AND RADIOLOGICAL DATA: B-type natriuretic peptide is 2556. CK 23, CPK is 2.3. WBC count 16.4, hemoglobin is 11.4, hematocrit 35.4, platelets are 287. MCV is 84 with a neutrophil percent of 83.5%. Glucose is 137, BUN is 24, creatinine 0.99, sodium is 141, potassium is 4, chloride is 104, CO2 is 28, calcium is 9.4, bilirubin is 0.3, alkaline phosphatase is 68, ALT is 14, AST is 13, total protein is 7, albumin is 3, osmolality is 287, GFR is 59, anion gap is 9.0. INR is 1, troponin is less than 0.2.  EKG shows normal sinus rhythm at 65 beats per minute. There is left ventricular hypertrophy, and there is poor R wave progression. Chest x-ray shows a left pleural effusion.   ASSESSMENT AND PLAN: A 70 year old female with diastolic congestive heart failure, chronic obstructive pulmonary disease with chronic respiratory failure, presenting with acute on chronic respiratory failure as a result of Graham likely pulmonary edema due to accelerated hypertension.   1. Acute on chronic respiratory failure due to pulmonary edema and pleural effusion: This is Graham likely due to accelerated hypertension. We will diurese the patient and fluid restrict, monitor ins and outs strictly. We will repeat chest x-ray in the morning. If persistent, we can consider ultrasound thoracentesis and evaluation of this pleural fluid as it was visualized on the last admission.   2. Accelerated hypertension: We will diurese the patient. We will reassess  her blood pressure after she has received her a.m. medications and will consider increasing her Imdur given that she appears to be relatively well beta blocked with heart rates in the 60s. This is to try and get a better control of her diastolic congestive heart failure.  3. Leukocytosis: There is no clear pneumonia on chest x-ray; however, she does have a relatively moderate-sized  effusion on that left side; so, at this time we will empirically cover her with cefepime, vancomycin and Zithromax , at least for one dose, and send blood cultures  given that the patient has been hospitalized multiple times over the last month. We will send urine cultures as well. We will recheck a CBC in the morning. If leukocytosis is improved, due to the fact that the patient has received quite a bit of antibiotics over the preceding 2 to 3 weeks, it may be reasonable to discontinue her antibiotics if cultures are negative.  4. Diabetes mellitus: She is currently stable. We will continue Lantus 30 units with Accu-Cheks and add sliding scale coverage, if needed.   5. Chronic obstructive pulmonary disease: She is currently stable and in no acute respiratory distress. She is not in acute exacerbation. We will continue her nebulizers, Spiriva, Advair.  6. Hypothyroidism, stable: Continue Synthroid.  7. Coronary artery disease, status post CABG: She had an echocardiogram 08/17 which showed preserved ejection fraction.  We will continue her ACE inhibitor, aspirin, calcium channel blocker, nitrates, hydralazine and beta blockade. We will continue her statin and Plavix as well.  8. Obstructive sleep apnea: Resume BiPAP at bedtime.  9. Anemia of chronic disease, stable: Continue ferrous sulfate.  10. Bipolar disorder, stable: Continue her home medications.  11. Prophylaxis: Lovenox.   DISPOSITION: The patient is being admitted to the floor on telemetry.   CODE STATUS: The patient is a LIMITED CODE. She does not want resuscitative efforts nor defibrillation if her heart were to stop. She is okay with intubation if the process is reversible. The surrogate decision maker is her daughter, Susan Graham.   TIME SPENT ON ADMISSION: 46 minutes reviewing old records and organizing plan of care as well as evaluating the patient.   ____________________________ Samson Frederic, DO aeo:cbb D: 10/15/2011 08:32:34  ET T: 10/15/2011 11:50:35 ET JOB#: [id number]  cc: Samson Frederic, DO, <Dictator> Mikeal Hawthorne Brynda Greathouse, MD [employer]per SIGNED 11/01/2011 0:33

## 2014-05-15 NOTE — Consult Note (Signed)
PATIENT NAME:  Susan Graham, Susan Graham MR#:  [id number] DATE OF BIRTH:  04/29/1945  DATE OF CONSULTATION:  09/15/2011  REFERRING PHYSICIAN:  Dr. Abel Presto  CONSULTING PHYSICIAN:  Tonda Wiederhold B. Darreld Hoffer, MD  REASON FOR CONSULTATION: To evaluate a patient with history of bipolar illness.   IDENTIFYING DATA: Susan Graham is a 69 year old female with history of bipolar disorder and anxiety.   CHIEF COMPLAINT: "I want to go home."  HISTORY OF PRESENT ILLNESS: Susan Graham has a history of bipolar disorder. She was hospitalized at [facility] in 2001 for a manic episode. She has been maintained on a combination of Effexor, Navane and Cogentin ever since with excellent results. She was admitted to medical floor for exacerbation of chronic obstructive pulmonary disease after she was found unresponsive by family members at her house. No medication overdose was suspected. I was asked to assess her medications as well as the capacity to make medical decisions especially about disposition. The patient has been living independently. She has a lot of help from family and health care professionals. She has a home health nurse who visits her six times a week three hours each day. She has a physical therapist coming to her house 3 times a week. Her boyfriend of 23 years has been very helpful as well as her daughter. The patient realizes that her condition is getting worse and she wants to hire additional person to spend nights with her to give some relief to the boyfriend. She just inherited some money following her mother's death and she believes that she can afford it. She did in the past spend some time at skilled nursing facility. She was at such facility between December and February 2013. She knows the drill and does not feel that this is what is needed now. She reports that she has been able to move around the house as well as ambulate from the house to the car to go places. While shopping or eating  out she uses a wheelchair. She ambulates around the house with a cane. She is not responsible for shopping or cooking. She reports several falls, she remembers three with last one a month ago that did not require hospitalization, there was no injury other than bruising. She reports that she has never had a similar episode as the one leading to current admission but she has house alarm to alert her family if she is not well. She reports excellent response to currently prescribed psychotropic medication and does not wish any changes. She denies any symptoms of depression or anxiety even though in the past she had several panic attacks a day. They disappeared completely. She is not psychotic. There are no symptoms suggestive of bipolar mania. She denies alcohol, illicit substance or prescription drug abuse.   PAST PSYCHIATRIC HISTORY: As above she was hospitalized at [facility] in 2001. She also recalls one prior hospitalization in the 80s after an overdose on medication due to depression. She used to see Dr. Rosana Hoes for her psychiatrist but recently apparently Dr. Brynda Greathouse is kind enough to prescribe her psychotropic medications.   FAMILY PSYCHIATRIC HISTORY: None reported.   PAST MEDICAL HISTORY:  1. Coronary artery disease with CABG in 2008. 2. Congestive heart failure with ejection fraction of 55%.  3. Chronic obstructive pulmonary with chronic respiratory failure on oxygen. 4. Hypertension. 5. Diabetes. 6. Morbid obesity.  7. History of cerebrovascular accident. 8. Hypothyroidism. 9. Gastroesophageal reflux disease. 10. Restless leg syndrome. 11. Sleep apnea on  CPAP.   ALLERGIES: Codeine, Levaquin, Risperdal, sulfa drugs.   MEDICATIONS ON ADMISSION:  1. Synthroid 75 mcg daily.  2. Norvasc 10 mg daily.  3. Vasotec 2.5 mg daily. 4. Aspirin 325 mg daily.  5. Cogentin 1 mg daily.  6. Hydralazine 25 mg 3 times daily.  7. Imdur 30 mg daily.  8. Metoprolol 100 mg daily.   9. Pramipexole 0.5 mg daily. 10. Pravachol 20 mg daily.  11. Percocet 5/325 every four hours as needed for pain.  12. Navane 2 mg at bedtime. 13. Plavix 75 mg daily.  14. Lantus 50 units at bedtime.  15. Effexor 75 mg daily.  16. Spiriva 18 mcg daily. 17. Ranitidine 150 mg daily.  18. NovoLog 7/30, 50 units daily.  19. Symbicort 2 puffs twice daily.  20. Lasix 40 mg daily.  21. Oxygen 2 liters nasal cannula.   MEDICATIONS AT THE TIME OF CONSULTATION:  1. Aspirin 324 mg daily. 2. Plavix 75 mg daily.  3. Flovent twice daily.  4. Synthroid 0.075 mg daily.  5. Multivitamin daily.  6. Pravachol 20 mg at bedtime. 7. Percocet 10/325 every six hours as needed for pain.  8. Sliding scale insulin.  9. Effexor XR 75 mg daily.  10. Accident Diskus 250/50 twice daily.  11. Imdur 30 mg daily.  12. Protonix 40 mg daily.  13. Mirapex 0.5 mg at night.  14. Navane 2 mg at bedtime.  15. Spiriva daily.  16. Rocephin IV injection 1 gram every 24 hours. 17. Benadryl 12.5 mg as needed for itching. 18. Vasotec 2.5 mg daily.  19. Hydralazine 25 mg 3 times daily.  20. Zofran as needed.  21. Metoprolol ER 100 mg daily.   SOCIAL HISTORY: She has been in a relationship of 23 years. She did not marry but has been together all along and the boyfriend is very supportive and helpful. She is retired. She reports that she does have activities outside of the house as well as feels comfortable moving around the house with no assistance. Her daughter, Susan Graham, telephone [phone], is helping making this patient's medical decisions.   REVIEW OF SYSTEMS: CONSTITUTIONAL: No fevers or chills. Positive for fatigue. Positive for gradual weight gain. EYES: No double or blurred vision. ENT: No hearing loss. RESPIRATORY: Positive for shortness of breath. CARDIOVASCULAR: No chest pain or orthopnea. GASTROINTESTINAL: No abdominal pain, nausea, vomiting, or diarrhea. GENITOURINARY: No incontinence or frequency. ENDOCRINE:  No heat or cold intolerance. LYMPHATIC: No anemia or easy bruising. INTEGUMENTARY: No acne or rash. MUSCULOSKELETAL: No muscle or joint pain. NEUROLOGIC: No tingling or weakness. PSYCHIATRIC: See history of present illness for details.   PHYSICAL EXAMINATION: VITAL SIGNS: Blood pressure 160/69, pulse 69, respirations 20, temperature 98.8.   GENERAL: This is an obese female breathing oxygen in no acute distress. The rest of the physical examination is deferred to her primary attending.   LABORATORY, DIAGNOSTIC AND RADIOLOGICAL DATA: Chemistries are within normal limits except for blood glucose of 147. LFTs are within normal limits except for AST of 49. Cardiac enzymes with elevated troponin I 0.14. CBC: White blood count on admission over 20,000, currently 12.7, RBC 3.64, hemoglobin 9.8, hematocrit 30.5, platelets 281. Urinalysis is suggestive of urinary tract infection. EKG: Sinus rhythm with first-degree AV block, left axis deviation, incomplete right bundle branch block, abnormal EKG.   MENTAL STATUS EXAMINATION: The patient is alert and oriented to person, place, and situation. She knows no date. Does not even know the year. She has no glasses  and is unable to look up the date on the board. She is pleasant, polite, and cooperative. She maintains good eye contact. There is psychomotor retardation. She is marginally groomed. Her speech is of normal rhythm, rate, and volume. She speaks with some difficulties due to oxygen. Her mood is fine with full affect. Thought processing is logical and goal oriented. Thought content: She denies thoughts of hurting herself or others. There are no delusions or paranoia. There are no auditory or visual hallucinations. Her cognition is grossly intact. The patient is able to register three out of three and recalls two out of three objects after five minutes. She was not ready to do any spelling or counting. She knows current president. Her insight and judgment seem fair.    SUICIDE RISK ASSESSMENT: This is a patient with a long history of bipolar illness well controlled on medication who was admitted to the hospital for exacerbation of her medical condition who is now in danger of losing her way of life and being placed in a nursing facility.   DIAGNOSES:  AXIS I:  1. Bipolar affective disorder, not otherwise specified in full remission.  2. 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   AXIS II: Deferred.   AXIS III: Multiple medical conditions as above.   AXIS IV: Chronic mental and physical illness, acute physical illness, cognitive decline, loss of way of life.   AXIS V: GAF 45.   PLAN:  1. The patient appears to have the capacity to make decisions about her disposition. She is able to discuss the topic, repeat back her options and understands pros and cons of returning home or going to a skilled nursing facility. I am not sure, however, if her assessment of her current living situation is accurate. In order to establish it I did talk to her daughter who helps her make medical decisions. The daughter is not really in favor or placement. The patient was placed at skilled nursing facility on several occasions in the past and the daughter is uncertain if this is really necessary at this point. She did not have a chance to visit with her mother yesterday but is coming to visit with her today. I will stay in touch with her to hear her opinion. The patient seems to have multiple resources and she already started planning on expanding an army of her caretakers. She realizes that it may be taking toll on her boyfriend and other family members.  2. Mood disorder. Please continue Navane 2 mg at night, Effexor 75 mg daily.  3. I will follow up.  ____________________________ Herma Ard B. Chella Chapdelaine, MD jbp:cms D: 09/15/2011 18:58:38 ET T: 09/16/2011 08:28:32 ET JOB#: [id number]  cc: Kahmari Koller B. Bary Leriche, MD, <Dictator>  Clovis Fredrickson MD ELECTRONICALLY  SIGNED 09/21/2011 0:41

## 2014-05-15 NOTE — H&P (Signed)
PATIENT NAME:  Susan Graham, Susan Graham MR#:  [id number] DATE OF BIRTH:  1945-12-27  DATE OF ADMISSION:  09/24/2011  PRIMARY CARE PHYSICIAN: Dr. Brynda Greathouse   CHIEF COMPLAINT: Acute onset of shortness of breath.   HISTORY OF PRESENT ILLNESS: This is a 69 year old female who was just recently discharged from the hospital about 8-9 days ago returns back due to acute onset of shortness of breath. As per the family and the patient she was in her usual state of health and doing well the past couple of days, improving with physical therapy at rehabilitation. Later this evening she was noted to be in worsening respiratory distress and noted to have low oxygen sats in the mid 80s. She was sent over to the ER for further evaluation. In the Emergency Room patient was noted to be in severe respiratory distress, placed on BiPAP immediately while arriving to triage. As per triage, she also was noted to have low-grade fever of 100.3 although patient cannot recall any chills. After being on BiPAP for the past couple of hours her respiratory symptoms have significantly improved. She denies any chest pain. She does admit to a cough, nonproductive. She denies any nausea, vomiting, abdominal pain, any fevers. No headache, no dizziness, no paroxysmal nocturnal dyspnea, no orthopnea. Patient here was noted to be in acute respiratory failure secondary to suspected pneumonia given her leukocytosis and low-grade fever. Hospitalist service was contacted for further treatment and evaluation.   REVIEW OF SYSTEMS: CONSTITUTIONAL: Positive for low-grade fever at 100.6. No weight gain. No weight loss. EYES: No blurred or double vision. ENT: No tinnitus. No postnasal drip. No redness of the oropharynx. RESPIRATORY: Positive cough. No wheezing, no hemoptysis. Positive dyspnea. CARDIOVASCULAR: No chest pain, no orthopnea, no palpitations, no syncope. GASTROINTESTINAL: No nausea, no vomiting, no diarrhea, no abdominal pain, no melena, no hematochezia.  GENITOURINARY: No dysuria, no hematuria. ENDOCRINE: No polyuria or nocturia. No heat or cold intolerance. HEME: No anemia. No bruising. No bleeding. INTEGUMENTARY: No rashes. No lesions. MUSCULOSKELETAL: No arthritis, no swelling, no gout. NEUROLOGIC: No numbness, no tingling, no ataxia, no seizure-type activity. PSYCH: Positive anxiety. No insomnia, no ADD.   PAST MEDICAL HISTORY:  1. Diabetes.  2. Hypertension.  3. Hyperlipidemia.  4. History of coronary artery disease, status post coronary artery bypass graft.  5. History of previous cerebrovascular accident with right-sided weakness. 6. History of hypothyroidism. 7. History of diastolic congestive heart failure. 8. Chronic obstructive pulmonary disease, oxygen dependent. 9. Obstructive sleep apnea.   ALLERGIES: Codeine, Levaquin, Risperdal and sulfa.   SOCIAL HISTORY: No smoking. No alcohol abuse. No illicit drug abuse. Currently resides at the skilled nursing facility at [facility].   FAMILY HISTORY: Notable for congestive heart failure in mother and coronary disease in father and two brothers.   CURRENT MEDICATIONS:  1. Synthroid 75 mcg daily.  2. Amlodipine 10 mg daily.  3. Vasotec 2.5 mg daily.  4. Aspirin 325 mg daily.  5. Benztropine 1 mg daily.  6. Hydralazine 25 mg t.i.d.  7. Lantus 30 units at bedtime.  8. Imdur 30 mg daily.  9. Metoprolol succinate 100 mg daily.  10. Pramipexole 0.5 mg daily.  11. Pravachol 20 mg daily. 12. Thiothixene 2 mg at bedtime.  13. Venlafaxine 75 mg daily.  14. Plavix 75 mg daily.  15. Spiriva 1 puff daily.  16. Advair 250/50, 1 puff b.i.d.  17. Ranitidine 150 mg daily.  18. Percocet 5/325, 1 tab every four hours as needed for pain.   PHYSICAL  EXAMINATION ON ADMISSION:  VITAL SIGNS: Patient's vital signs are noted to be: Temperature 96.5, pulse 69, respirations 24, blood pressure 177/66, sats are currently 95% to 96% on 2 liters nasal cannula.   GENERAL: She is a pleasant appearing  female, anxious, in mild respiratory distress.   HEENT: Patient is atraumatic, normocephalic. Extraocular muscles are intact. Pupils equal, reactive to light. Sclerae anicteric. No conjunctival injection. No pharyngeal erythema.   NECK: Supple. There is no jugular venous distention. No bruits. No lymphadenopathy. No thyromegaly.   HEART: Regular rate and rhythm. No murmurs, no rubs, no clicks.   LUNGS: She has poor respiratory effort, shallow respirations otherwise, no rales, no rhonchi, no wheezes noted. Negative use of accessory muscles. No dullness to percussion.   ABDOMEN: Soft, flat, nontender, nondistended. Has good bowel sounds. No hepatosplenomegaly appreciated.   EXTREMITIES: She does have +1 pitting edema from the knees to the ankles bilaterally. No evidence of any cyanosis, clubbing. +2 pedal and radial pulses bilaterally.   NEUROLOGICAL: Patient is alert, awake, oriented x3, anxious appearing but no other focal motor or sensory deficits appreciated.   SKIN: Moist, warm with no rashes.   LYMPHATIC: There is no cervical or axillary lymphadenopathy.   LABORATORY, DIAGNOSTIC, AND RADIOLOGICAL DATA: Serum glucose 211, BUN 30, creatinine 1.5, sodium 141, potassium 4.4, chloride 103, bicarbonate 29. LFTs are within normal limits. Troponin 0.05, white cell count 25.8, hemoglobin 10.5, hematocrit 32.7, platelet count 395.   ASSESSMENT AND PLAN: This is a 70 year old female with history of chronic obstructive pulmonary disease, coronary disease, status post coronary artery bypass graft, history of diastolic congestive heart failure, diabetes, hypertension, hyperlipidemia, anxiety/depression, obstructive sleep apnea who presents to the hospital due to acute onset of shortness of breath and noted to be in respiratory failure.  1. Acute on chronic respiratory failure. The exact etiology of this is currently unclear but likely multifactorial in nature given her history of chronic obstructive  pulmonary disease, congestive heart failure and sleep apnea. She apparently is supposed to be on CPAP but was not using it at the skilled nursing facility. She also has a leukocytosis with low-grade fever and there is some concern of possible pneumonia. I will empirically start her on antibiotics to cover for hospital-acquired pneumonia given her recent skilled nursing facility stay. Will start her on some vancomycin and Zosyn. Follow sputum cultures and blood cultures. She currently has clinically improved on BiPAP. I have reduced her down to just nasal cannula. Will get an ABG now. Will place her on CPAP at bedtime. Continue her maintenance medications for her chronic obstructive pulmonary disease and oxygen supplementation and follow her clinically.  2. History of congestive heart failure, diastolic in nature. Clinically she does not appear to be in congestive heart failure. I will continue her beta blocker for now. Hold her Vasotec given some mild renal failure.  3. Chronic obstructive pulmonary disease. No acute exacerbation. She is not bronchospastic. I will continue her on Advair and Spiriva and oxygen supplementation.  4. Diabetes. Continue Lantus and sliding scale insulin coverage. Follow blood sugars.  5. History of coronary artery disease. Status post coronary artery bypass graft. There is no evidence of acute coronary syndrome. Enzymes x1 are negative. Continue aspirin, Plavix, beta blocker, and statin for now. 6. Hypothyroidism. Continue Synthroid.  7. Anxiety/depression. Continue thiothixene and Effexor for now.  8. Restless leg syndrome. Continue Mirapex.  9. Leukocytosis. Suspect this is due to underlying pneumonia. Will treat her with IV antibiotics and follow her  white cell count. 10. CODE STATUS: Patient is a LIMITED CODE. She does want to be intubated. She does want to be put on mechanical ventilator but no cardiopulmonary resuscitation, no defibrillation.   TIME SPENT WITH THE  ADMISSION: 50 minutes.  ____________________________ Belia Heman. Verdell Carmine, MD vjs:cms D: 09/24/2011 03:00:08 ET T: 09/24/2011 08:10:46 ET JOB#: [id number]  cc: Belia Heman. Verdell Carmine, MD, <Dictator> Mikeal Hawthorne. Brynda Greathouse, MD Henreitta Leber MD ELECTRONICALLY SIGNED 09/28/2011 20:21

## 2014-05-15 NOTE — Consult Note (Signed)
Brief Consult Note: Diagnosis: Resp Failure/SOB/COPD/CHF.   Patient was seen by consultant.   Consult note dictated.   Recommend further assessment or treatment.   Discussed with Attending MD.   Comments: IMP Resp Failure COPD CHF HTN DM OSA CVA CAD Obesity Hyperlipidemia . PLAN Vent Support O2 Antibx Inhalers Foley Lasix ROMI .DM control Statin Pul critical care input.  Electronic Signatures: Lujean Amel D (MD)  (Signed [phone] 08:24)  Authored: Brief Consult Note   Last Updated: 24-Nov-13 08:24 by Yolonda Kida (MD)

## 2014-05-15 NOTE — Consult Note (Signed)
Brief Consult Note: Diagnosis: Bipolar affective disorder, anxiety disorder.   Patient was seen by consultant.   Consult note dictated.   Recommend further assessment or treatment.   Orders entered.   Discussed with Attending MD.   Comments: Susan Graham has a h/o depression, anxiety and mood instability. She has been maintained on a combination of Navane, Effexor and Cogentin for years with excellent results. She was admitted for exacrebation of COPD. I was asked to assess her capacity to decide about her disposition.   PLAN: 1. The patient appears to have the capacity to make decisions about her disposition. I am not sure if her assessment of the living situation is accurate. There are opinions coming from the family that they are "tired of helping out". I spoke with her daughter, who helps the patient with decision making. She did not have a chance to visit with her mother yesterday but is coming to see her today. I will stay in phone contact with her to hear her opinion.  2. If there are no contraindications, I would continue al her psychotropic medications..  Electronic Signatures: Orson Slick (MD)  (Signed 20-Aug-13 18:37)  Authored: Brief Consult Note   Last Updated: 20-Aug-13 18:37 by Orson Slick (MD)

## 2014-05-15 NOTE — H&P (Signed)
PATIENT NAME:  Susan Graham, ALTAMIRANO MR#:  [id number] DATE OF BIRTH:  04-14-1945  DATE OF ADMISSION:  09/10/2011  PRIMARY CARE PHYSICIAN: Nicky Pugh, MD  CHIEF COMPLAINT: Unresponsive, respiratory distress.   HISTORY OF PRESENT ILLNESS: Please note that history is obtained from the ED physician as well as the family. The patient is intubated at the time of my evaluation. This is a 69 year old female with chronic medical conditions notable for coronary artery disease status post CABG, diastolic heart failure with an ejection fraction of 55%, and chronic obstructive pulmonary disease on nasal cannula chronically who presents with acute respiratory failure. Per family the patient was in her usual state of health, actually many family members spoke with her today and she was doing well. She was last seen normal by her boyfriend at 9:00 p.m when he placed nasal cannula on her. She was sitting down and then he went to his home. Per daughter, at about 10:15 p.m., she was alerted by [facility] that the patient's device had been set off so they went to her home where EMS was resuscitating her with CPAP. Of note, per EMS, the patient was unresponsive with a pulse so CPAP was initiated she was transferred to the Emergency Department. In the Emergency Department she was noted to be in severe respiratory distress. She was saturating 90% on CPAP so she was intubated for acute respiratory failure.   Regarding the review of systems, family members noted that one day prior to admission she did report feeling "cold", although other family members in the room were comfortable. Another family member did comment that she seemed to have more dyspnea on exertion yesterday evening. Family members deny any recent upper respiratory infection symptoms or recent illnesses. They do admit that she does have chronic respiratory failure and mild exertion does cause her to be short of breath, so for instance, she is able to walk apparently in  her living room two circles before she becomes short of breath with physical therapy. So it is unclear if the witnessed dyspnea one day prior to admission is her baseline or evidence of an impending illness.   In the Emergency Department, the patient was noted to have significant leukocytosis with white count of 20,000, hypercarbia with a pH of 7.24 and CO2 of 50, and metabolic acidosis, lactic acidosis, positive urinalysis, and chest x-ray that is concerning for left lower lobe infiltrate so the hospitalist team has been called to admit the patient for further management. She has received IV vancomycin and Zosyn.   PAST MEDICAL HISTORY:  1. Recent hospitalization in July 2013 for respiratory failure deemed multifactorial from heart failure, OSA and deconditioning. Prior to that had hospitalization in June for pneumonia.  2. Coronary artery disease status post CABG in 2008 at [facility].  3. Diastolic congestive heart failure with ejection fraction 55%.  4. Chronic obstructive pulmonary disease with chronic respiratory failure, on nasal cannula.  5. Hypertension. 6. Diabetes mellitus.  7. Morbid obesity.  8. History of pseudotumor cerebri.  9. History of bipolar/anxiety disorder.  10. History of cerebrovascular accident after her CABG leading to mild right-sided hemiparesis.  11. Hypothyroidism. 12. Gastroesophageal reflux disease. 13. History of tobacco.  14. History of leg syndrome.  15. Sleep apnea with inconsistent use of CPAP at night per prior records.   PAST SURGICAL HISTORY:  1. Coronary artery bypass graft. 2. Hysterectomy. 3. Left knee surgery.   ALLERGIES: Levaquin causes itching. Sulfa reaction unknown. Codeine unknown. Risperdal unknown.  CURRENT MEDICATIONS: (Per discharge record as of 08/04/2011) 1. Synthroid 75 mcg daily. 2. Norvasc 10 mg daily.  3. Vasotec 2.5 mg daily.  4. Aspirin 325 mg daily.  5. Benztropine 1 mg daily.  6. Hydralazine 25 mg three times  daily.  7. Imdur 30 mg daily.  8. Metoprolol 100 mg daily. 9. Pramipexole 0.5 mg daily.  10. Pravachol 20 mg daily.  11. Percocet 5/325 mg every four hours as needed for pain.  12. Thiothixene 2 mg at bedtime. 13. Plavix 75 mg daily.  14. Lantus 50 units at bedtime, although records from family, who are providing these medications, they say Lantus 65 units at bedtime, unclear what the patient is actually taking.  15. Effexor 75 mg daily.  16. Spiriva 18 mcg daily.  17. Ranitidine 150 mg daily.  18. NovoLog mix 70/30 50 units daily.  19. Symbicort 2 puffs twice a day.  20. Lasix 40 mg daily.  21. Oxygen 2 liters nasal cannula.   SOCIAL HISTORY: Prior tobacco use, quit in 2008. Does not drink alcohol. No history of drug use, according to her records, and the family.  FAMILY HISTORY: Notable for congestive heart failure in mother and coronary artery disease in father and two brothers.   REVIEW OF SYSTEMS: Unable to obtain due to mental status. The patient is currently intubated and sedated.   PHYSICAL EXAM:   VITALS: Blood pressure 155/68, temperature pending, other vitals are pending. The patient is currently intubated saturating 100% on current vent settings.  Vent Settings: 450 tidal volume, PFV 10, PEEP 5, rate 14.  GENERAL: Obese Caucasian female, currently intubated and sedated.   HEENT: Atraumatic, normocephalic. Pupils are equal 2 centimeter, mildly reactive to light. Normal external ears and nares.   NECK: No JVD.   CARDIOVASCULAR: Regular rate and rhythm. No murmurs.   LUNGS: Diffuse rhonchi.   ABDOMEN: Soft, nontender, and nondistended with normal bowel sounds.   EXTREMITIES: 1+ edema in bilateral lower extremities. There are decreased bilateral pedal pulses.   SKIN: No rash. She does have some bruising on the right hand.  EXTREMITIES: No clubbing, cyanosis, or edema.   NEUROLOGIC: No focal abnormality at this time. Unable to access strength and sensation, cranial  nerves.   LABORATORY, DIAGNOSTIC AND RADIOLOGIC DATA: Glucose 350, BUN 27, and creatinine 1.52. Review of records (shows creatinine has been as high as 1.92. On last discharge creatinine was 1.66).  Sodium 137, potassium 5.2, chloride 105, bicarbonate 22, calcium 9.4. Bilirubin total 0.4, alkaline phosphatase 88, ALT 32, AST 49, total protein 7.7, albumin 3.1, and osmolality 293. GFR is 35. Anion gap is 11. Troponin is 0.14. CK is 94 and CK-MB is 2.1. WBC count is 20.7, hemoglobin 11.4, hematocrit 37.4, platelet count 443, MCV 86, and neutrophils are 70.7%. INR is 1.  PT is 13.1.  Urinalysis shows 50 glucose, negative blood, 100 protein, positive nitrites, 1+ leukocyte esterase, 7 RBCs, 28 white blood cells, 3+ bacteria, and 5 epithelial cells.   ABG shows pH 7.24, pCO2 50, pO2 149, and FiO2 60 with a base excess of negative 6.3 and bicarbonate of 21.3. Saturating 98% on the vent. Lactic acid 5.  CT of the head shows no acute intracranial hemorrhage.   Chest x-ray shows possible left lower lobe infiltrate.   EKG shows sinus rhythm at 71 beats per minute with first-degree AV block, left axis deviation and complete right bundle branch block. There is ST segment depression in V4 and V5, about 1 mm and  in lead II about 1 mm as well. Inverted T waves are noted in leads I, II, and aVL. There is also mild depression in lead V3. When compared to EKG done on 08/02/2011, the ST changes in the precordial leads are new.   ASSESSMENT AND PLAN: A 69 year old female found minimally responsive at home with etiology unclear, found to be in hypercarbic respiratory failure, severe sepsis, concern for left lower lobe pneumonia, urinary tract infection, toxic metabolic encephalopathy, and possible non-ST-elevation myocardial infarction.  1. Acute on chronic respiratory failure. Hypercarbic respiratory failure possibly due to chronic obstructive pulmonary disease exacerbation versus healthcare associated pneumonia. She is  status post Zosyn and vancomycin. I will continue Zosyn and vancomycin and add ciprofloxacin. We will treat with nebulizers. We will check blood,sputum and urine cultures. Check CT of the chest She is intubated and we will continue mechanical ventilation. We will check repeat ABG and adjust vent settings as needed.  2. Severe sepsis, likely due to the pneumonia and urinary tract infection. We will continue antibiotics as above and monitor for now. The patient is maintaining her blood pressures. There is no central venous access at this time and she is not on vasopressors.  3. Toxic metabolic encephalopathy. This is multifactorial from respiratory failure and infection, also she is currently sedated on propofol. We will wean this as tolerated as respiratory status improves. CT of head has ruled out acute hemorrhage. Check urine drug screen to rule out any substance overdose. 4. Concern for NSTEMI given troponin rise to 0.14. On review of prior admission, the patient has come in with elevated troponin. Last admission in July troponin was up to 0.11. However, the EKG at this presenting time is slightly different. This could be demand due to severe pulmonary distress versus acute NSTEMI We will place a cardiology consult at this time, trend cardiac enzymes. If the next set of cardiac enzymes are elevated, we will start therapeutic anticoagulation.  5.  Mixed respiratory and metabolic acidosis, uncompensated: due to combination hypercarbia and lactic acidosis.. Continue IV fluids, and mechanical ventilation. Will check repeat labs. 6.  UTI: antibiotics and cultures as above. 7. Hyperkalemia: mild, IVF, reassess with BMP. Pt is getting albuterol nebs. 8. Hypertension. Given the patient's critical state, we will manage with IV medications for systolic blood pressure greater than 160. We are holding her p.o. antihypertensives. 9. Uncontrolled diabetes. The patient does have elevated insulin requirements at home. She  will be placed n.p.o. currently and will start with Lantus 20 units nightly with sliding scale insulin every 6 hours while NPO and titrate as needed.  10. Chronic kidney disease stage III. This is stable at this time.  11. Hypothyroidism. This is stable. We will continue Synthroid.  12. History of coronary artery disease. Plan as above for NSTEMI. We will continue aspirin and Plavix currently. Stable. Continue her Pravachol.  13. History of bipolar/anxiety disorder. We are currently holding her medications for now.  14. Deep vein thrombosis prophylaxis. She has heparin subcutaneous as well as Protonix for prophylaxis.   DISPOSITION: The patient is being admitted to the Intensive Care Unit.   CODE STATUS: The patient is a DO NOT RESUSCITATE. This was discussed extensively with all family members present. Her decision-maker is her daughter, Ms. Tammy Cody. Apparently since the patient witnessed the demise of her mother about 10 months back, she has clearly stated that she does not want to be resuscitated. The case was discussed with the family.   TIME SPENT: 90  minutes. ____________________________ Samson Frederic, DO aeo:[id number] D: 09/10/2011 03:49:17 ET T: 09/10/2011 06:58:41 ET JOB#: [id number]  cc: Samson Frederic, DO, <Dictator> Mikeal Hawthorne Brynda Greathouse, MD [place] SIGNED 09/11/2011 3:36

## 2014-05-15 NOTE — Discharge Summary (Signed)
PATIENT NAME:  Susan Graham, Susan Graham MR#:  [id number] DATE OF BIRTH:  28-Sep-1945  DATE OF ADMISSION:  10/15/2011 DATE OF DISCHARGE:  10/19/2011  PRESENTING COMPLAINT: Shortness of breath.   DISCHARGE DIAGNOSES:  1. Acute on chronic congestive heart failure, mild, systolic.  2. Morbid obesity.  3. Hypertension.  4. Obstructive sleep apnea.  5. History of chronic respiratory failure. The patient is on chronic home oxygen.   MEDICATIONS:  1. Levothyroxine 75 mcg p.o. daily.  2. Vasotec 2.5 mg daily.  3. Benztropine 1 mg daily.  4. Hydralazine 25 mg 3 times a day.  5. Metoprolol ER 100 mg daily.  6. Pramipexole 0.5 mg 1 p.o. daily.  7. Pravastatin 20 mg p.o. daily.  8. Venlafaxine 75 mg daily.  9. Spiriva 18 mcg inhalation daily.  10. Advair Diskus 250/50, one puff b.i.d.  11. Ferrous sulfate 325 mg p.o. daily.  12. Iron p.o. daily.  13. Percocet 5/325, one every four hours as needed.  14. Amlodipine 10 mg daily.  15. Aspirin 325 mg p.o. daily.  16. Imdur 30 mg extended release p.o. daily.  17. Thiothixene 2 mg at bedtime.  18. Zantac 150 mg daily.  19. Lantus 30 units at bedtime.  20. DuoNebs 3 mL every inhalation q.i.d.  21. Maalox 30 mL 3 times a day.  22. Lasix 20 mg daily.  23. Nitrofurantoin 1 capsule b.i.d. for three more days.  24. Plavix 75 mg.  25. Docusate Sodium 100 mg b.i.d.  26. Senokot 8.6 mg 1 tablet b.i.d.  27. 2 liters nasal cannula oxygen continuous.   DIET: Low sodium, low fat, cholesterol diet.   FOLLOWUP:  1. Follow up with Dr. Brynda Greathouse in 1 to 2 weeks.  2. Home health physical therapy arranged.   LABORATORY, RADIOLOGICAL AND DIAGNOSTIC DATA: Glucose 82, BUN 30, creatinine 1.5, sodium 140, potassium 3.8, chloride 99, bicarbonate 31, calcium 9.3. Troponin less than 0.02 x3. Chest x-ray is consistent with congestive heart failure on admission. Repeat chest x-ray showed clearing. Hemoglobin and hematocrit 10.0 and 31.0. LFTs within normal limits with albumin of  2.8. Blood cultures negative in 48 hours. Urinalysis positive for urinary tract infection. Urine shows Citrobacter and Klebsiella pneumoniae. Beta natriuretic peptide is 2,556.   BRIEF SUMMARY OF HOSPITAL COURSE: Susan Graham is a 69 year old Caucasian female with congestive heart failure, chronic obstructive pulmonary disease with chronic respiratory failure, on oxygen comes in with:  1. Acute on chronic hypoxic respiratory failure due to pulmonary edema and pleural effusion, most likely due to accelerated hypertension and congestive heart failure, appears mild systolic. The patient was started on IV Lasix. She had urine output about 5.5 liters since admission, changed to p.o. Lasix 20 mg daily. Repeat x-ray showed clearing of congestive heart failure. No indication for antibiotics since the patient did not show any evidence of pneumonia or respiratory infection. Low salt diet discussed. Weaned down oxygen to home dose of 2 liters.  2. Accelerated hypertension: Continue Hydralazine, Imdur, Metoprolol, Norvasc and Vasotec.  3. Leukocytosis. There is no clear pneumonia on chest x-ray. Antibiotics discontinued.  4. Diabetes. She is on Lantus with Accu-Cheks. Added sliding scale also. Sugars were well controlled.  5. Chronic obstructive pulmonary disease. She is currently stable, not in respiratory distress, not in acute distress. We continued nebulizer, Spiriva and Advair.  6. Urinary tract infection. Citrobacter and Klebsiella. The patient will finish up nitrofurantoin.  7. Coronary artery disease status post coronary artery bypass graft. Her home medications including statins, aspirin, ACE  inhibitors, nitrates, hydralazine, beta blockers and Imdur were continued.  8. Obstructive sleep apnea, on BiPAP.  9. Anemia of chronic disease. Remained stable. The patient remained on ferrous sulfate.  10. Physical therapy recommended rehab, however, the patient was adamant to go home. Home health PT and CHF nursing  has been set up.   Hospital stay otherwise remained stable. The patient remained a NO CODE, DO NOT RESUSCITATE.   TIME SPENT: 40 minutes.  ____________________________ Hart Rochester Posey Pronto, MD sap:ap D: 10/19/2011 13:34:41 ET T: 10/20/2011 14:13:56 ET JOB#: [id number]  cc: Kasara Schomer A. Posey Pronto, MD, <Dictator> Mikeal Hawthorne. Brynda Greathouse, MD Ilda Basset MD ELECTRONICALLY SIGNED 10/20/2011 16:11

## 2014-05-15 NOTE — Discharge Summary (Signed)
PATIENT NAME:  Susan Graham, Susan Graham MR#:  [id number] DATE OF BIRTH:  1945/07/26  DATE OF ADMISSION:  09/24/2011 DATE OF DISCHARGE:  09/30/2011  ADDENDUM   Please refer to discharge summary dictated on 09/25/2011. Change the discharge date of 09/30/2011.   The patient was actually ready to be discharged that day but unfortunately whenever the facility she was going to was contacted they needed to have a type 2 PASARR evaluation. Since it was the weekend holiday the patient had a significant delay. She wanted to go home but unfortunately she is not able to do much around. She is in bed all the time with a very high risk of falls and very high risk of decubitus ulcers.   The patient actually had lot of trouble with me telling her that she needed to go to a facility. She was significantly upset about that but fortunately after a couple days talking with her and her family she understood that it was for her own good.   As far as other medical problems here the patient had to add on some anemia that was followed and it is mostly chronic disease. Her acute on chronic respiratory failure resolved and she has been asymptomatic since the 30th. Her acute kidney failure resolved as well with last creatinine being around 1. Her diabetes was overall controlled. Her coronary artery disease did not have any exacerbations over here. As mentioned above her anemia is chronic disease, normal and her iron levels are a little bit low with saturation of 50. The ferritin is 46 and TIBC is 332. All that is sign of chronic disease anemia. Her hemoglobin at discharge is 9.6 which is stable from admission at 10.   Other than that the patient did okay.  TIME SPENT: I spent about 25 minutes with this discharge today. ____________________________ [place] Sink, MD rsg:cms D: 09/30/2011 13:46:59 ET T: 10/02/2011 10:33:36 ET  JOB#: [id number] cc: Heath Sink, MD, <Dictator> Nija Koopman America Brown  MD ELECTRONICALLY SIGNED 10/02/2011 14:30

## 2014-05-15 NOTE — Discharge Summary (Signed)
PATIENT NAME:  Susan Graham, Susan Graham MR#:  [id number] DATE OF BIRTH:  January 31, 1945  DATE OF ADMISSION:  09/10/2011 DATE OF DISCHARGE:  09/16/2011  For a detailed note, please take a look at the history and physical done by Dr. Cindie Laroche on admission.   DISCHARGE DIAGNOSES: 1. Acute respiratory failure secondary to chronic obstructive pulmonary disease and aspiration pneumonitis.  2. Sepsis secondary to urinary tract infection.  3. Urinary tract infection secondary to Citrobacter freundii.   4. Diabetes.  5. Hypertension.  6. Hyperlipidemia.  7. Anxiety.  8. Acute renal failure.  9. Elevated troponin secondary to demand ischemia.   DIET: The patient is being discharged on a low-sodium, American Diabetic Association low-fat mechanical soft diet.   ACTIVITY: As tolerated.   DISCHARGE FOLLOWUP: Followup with Dr. Nicky Pugh in the next 1 to 2 weeks and also with the physician at the skilled nursing facility.   DISCHARGE MEDICATIONS: 1. Synthroid 75 mcg daily.  2. Amlodipine 10 mg daily.  3. Vasotec 2.5 mg daily.  4. Aspirin 325 mg daily. 5. Benztropine 1 mg daily.  6. Hydralazine 25 mg three times daily. 7. Lantus 30 units at bedtime.  8. Imdur 30 mg daily.  9. Metoprolol succinate 100 mg daily.  10. Pramipexole 0.5 mg daily.  11. Pravachol 20 mg daily.  12. Thiothixene 2 mg at bedtime. 13. Venlafaxine 75 mg daily.  14. Plavix 75 mg daily.  15. Spiriva 1 puff daily.  16. Ranitidine 150 mg daily.  17. Advair 250/50 one puff twice a day. 18. Percocet 5/325 mg 1 tab every four hours as needed.  19. Ceftin 500 mg twice a day x3 days.   [place] COURSE:  1. Serafina Royals, MD - Cardiology. 2. Efraim Kaufmann, MD - Palliative Care. 3. Devona Konig, MD - Pulmonary. 4. Orson Slick, MD - Psychiatry.   PERTINENT STUDIES DURING HOSPITAL COURSE: CT scan of the chest done without contrast on admission is showing a small right pleural effusion, bilateral lower  airspace disease consistent with atelectasis versus pneumonia.   CT of the head done on admission is showing no acute intracranial process.   Chest x-ray done on admission is showing lungs are hypoinflated with endotracheal tube in place.   A two-dimensional echocardiogram done 09/12/2011 is showing left ventricular systolic function to be normal at 50%, left atrium moderately dilated, right atrium moderately dilated, moderate mitral regurgitation and moderate tricuspid regurgitation.   HOSPITAL COURSE: This is a 69 year old female with multiple medical problems as mentioned above who presented to the hospital on 09/10/2011 secondary to unresponsiveness and respiratory failure. The patient was urgently intubated in the Emergency Room and admitted to the Intensive Care Unit.  1. Acute respiratory failure - The patient was likely in acute on chronic respiratory failure. The patient has underlying severe chronic obstructive pulmonary disease. Her respiratory failure was likely secondary to aspiration pneumonitis. She was empirically started on IV antibiotics and as mentioned she was intubated. She was only intubated for about 48 hours and shortly thereafter extubated and has been on her baseline oxygen at 2 liters since being extubated. As mentioned, the patient was empirically treated with IV antibiotics for aspiration pneumonia, although or sputum cultures and blood cultures remained negative. Her respiratory status is now back down to baseline. She is being maintained on her maintenance medications for her chronic obstructive pulmonary disease including Advair, Spiriva, and nebulizer treatments and has been clinically doing well.  2. Sepsis - The patient did present to  the hospital with suspected sepsis secondary to aspiration pneumonia and urinary tract infection. She was noted to have a leukocytosis and was hypotensive on admission. The patient was empirically first treated with the vancomycin and Zosyn.  The patient's urine culture grew out Citrobacter freundii. Her blood cultures were consistent with a contaminant. After being extubated she was taken off vasopressors and has remained hemodynamically stable. She has been afebrile and her white cell count has improved. She will finish a few more days of p.o. Ceftin for treatment of her urinary tract infection.  3. Acute renal failure - this was likely secondary to septic shock and ATN. Once her sepsis and hemodynamics had improved, her renal function has come back down to baseline.  4. Hypertension - Initially the patient's antihypertensives were held due to septic shock although shortly after being extubated her blood pressures have actually been more on the higher side. Her blood pressure medications were then resumed including her Toprol, Norvasc, Vasotec, and hydralazine and her blood pressures have significantly improved after being started on home medications. 5. Chronic obstructive pulmonary disease - The patient again after extubation has been maintained on her maintenance inhalers including Advair and Spiriva along with some nebulizer treatments. She will resume those upon discharge.  6. Diabetes - prior to coming to the hospital the patient was on a very high dose of Lantus along with NovoLog 70/30. Right now her blood sugars have remained fairly well controlled just on sliding scale insulin so I am cutting her dose of Lantus in half and she will only be discharged on that presently with close follow-up of her blood sugars as an outpatient.  7. Chronic anemia - This was likely anemia of chronic disease/iron deficiency anemia. She had no evidence of any acute blood loss. Her Hemoccult was negative. I will be discharging her on some iron supplements for now. She will continue on aspirin and Plavix as stated given her history of coronary artery disease.         CODE STATUS: The patient is a LIMITED CODE. She wants to be intubated and put on  mechanical ventilator, but she does not want cardiopulmonary resuscitation or defibrillation.   TIME SPENT DISCHARGE: 40 minutes.  ____________________________ Belia Heman. Verdell Carmine, MD vjs:slb D: 09/16/2011 10:27:52 ET T: 09/16/2011 10:45:23 ET JOB#: [id number]  cc: Belia Heman. Verdell Carmine, MD, <Dictator> Mikeal Hawthorne. Brynda Greathouse, MD Henreitta Leber MD ELECTRONICALLY SIGNED 09/16/2011 12:55

## 2014-05-15 NOTE — H&P (Signed)
PATIENT NAME:  Susan Graham, Susan Graham MR#:  [id number] DATE OF BIRTH:  [birth year]  DATE OF ADMISSION:  12/19/2011  PRIMARY CARE PHYSICIAN: Nicky Pugh, MD  CHIEF COMPLAINT: Hypoxia and shortness of breath.   HISTORY OF PRESENT ILLNESS: This is a 69 year old female who presents to the hospital from a skilled nursing facility due to hypoxia and low oxygen saturations and noted to be in acute on chronic respiratory failure. The patient's oxygen saturations were in the low 80s at the skilled nursing facility. When she came to the ER, she was still quite hypoxic and short of breath and therefore was urgently intubated. Most of the history is obtained from the chart and also from the family at bedside. The patient has a previous history of congestive heart failure and chronic obstructive pulmonary disease and was actually discharged to a hospice facility a few months back. She apparently perked up, did well, and was discharged from the hospice facility back to the skilled nursing facility and she has been at [facility] for about three weeks. She had been doing okay until this morning when the staff found her to be somewhat somnolent, unresponsive, and noted to be hypoxic, and sent to the ER. She was noted to be in respiratory failure, likely secondary to congestive heart failure. Hospitalist service was then contacted for further treatment and evaluation.   REVIEW OF SYSTEMS: Currently unobtainable given the patient's mental status and the fact that she is sedated and intubated.  PAST MEDICAL HISTORY:  1. Diabetes. 2. Hypertension. 3. Hyperlipidemia. 4. Coronary artery disease status post coronary artery bypass graft.  5. Previous cerebrovascular accident with right-sided weakness. 6. Hypothyroidism. 7. History of diastolic congestive heart failure. 8. Chronic obstructive pulmonary disease, oxygen dependent. 9. Obstructive sleep apnea.   ALLERGIES: Codeine, Levaquin, Risperdal, and sulfa.   SOCIAL  HISTORY: She used to be a smoker, quit smoking a while back. No alcohol abuse. No illicit drug abuse. She lives in a skilled nursing facility, at [place], presently.   FAMILY HISTORY: History is notable for congestive heart failure in mother and coronary artery disease in father and two brothers.   CURRENT MEDICATIONS: (Based on the [facility] at the skilled nursing facility).  1. Amlodipine 10 mg daily.  2. Aspirin 325 mg daily.  3. Cogentin 1 mg daily.  4. Compazine 10 mg every 4 hours as needed. 5. Dulcolax as needed.  6. Effexor 75 mg daily.  7. Enalapril 2.5 mg daily.  8. Iron sulfate 325 mg daily.  9. Lantus 30 units at bedtime.  10. Ativan 0.5 mg every 2 hours as needed.  11. Humalog 75/25, 50 units daily at 6:00 a.m. 12. Navane (thiothixene) 2 mg at bedtime. 13. Pravachol 20 mg daily.  14. Roxanol 20 mg every 1 hours as needed.  15. Senokot 1 tab twice a day as needed.  16. Spiriva 1 puff daily.  17. Synthroid 75 mcg daily.  18. Toprol 100 mg daily.  19. Zantac 150 mg daily.   PHYSICAL EXAMINATION:  VITALS: On admission temperature is afebrile, pulse 60, respirations 18, blood pressure 128/63, and saturation 94% on ventilator.   GENERAL: She is a sedated, intubated, critically ill-appearing patient, but in no apparent distress.   HEENT: Atraumatic, normocephalic. Her pupils are equal and reactive to light. Sclerae anicteric. No conjunctival injection. No pharyngeal erythema.   NECK: Supple. No jugular venous distention, no bruits, and no lymphadenopathy or thyromegaly.   CARDIAC: regular rate and rhythm. No murmurs, rubs, or clicks.  PULMONARY:  clear to auscultation anteriorly. No rales, no rhonchi, and no wheezes.   ABDOMEN: Soft, flat, nontender, and nondistended. Good bowel sounds. No hepatosplenomegaly is appreciated.   EXTREMITIES: No evidence of any cyanosis or clubbing. She does have +1 to 2 pitting edema from the knees to the ankles bilaterally.   SKIN: Moist  and warm with no rashes.   LYMPHATIC: There is no cervical lymphadenopathy.   NEUROLOGIC: She is a sedated, intubated, difficult to do a full neurological exam.   LABS/RADIOLOGIC STUDIES: Serum glucose 226. BNP 5691. BUN 14, creatinine 1.3, sodium 137, potassium 4.4, chloride 103, and bicarbonate 27. LFTs are within normal limits. Troponin less than 0.02. White cell count 25, hemoglobin 12.7, hematocrit 41.5, and platelet count 555. INR 1.1.   Urinalysis shows 2+ leukocyte esterase, 16 red cells, and 261 white cells with trace bacteria.   ABG showed a pH of 7.4, pCO2 44, and pO2 475.   ASSESSMENT AND PLAN: This is a 69 year old female with history of coronary artery disease status post coronary artery bypass graft, hypertension, hyperlipidemia, chronic obstructive pulmonary disease, obstructive sleep apnea, history of diastolic congestive heart failure, and hypothyroidism who presents to the hospital from a skilled nursing facility due to hypoxia and low oxygen saturations in the mid 80s. The patient was noted to be in respiratory failure and urgently intubated in the Emergency Room.  1. Acute on chronic respiratory failure. This is likely secondary to exacerbation of congestive heart failure, likely diastolic dysfunction combined with underlying chronic obstructive pulmonary disease and obstructive sleep apnea. I will go ahead and aggressively diurese her with IV Lasix twice a day, 40 mg, and follow ins and outs and daily weights. Continue metoprolol, her enalapril and Imdur for now. We will get a pulmonary consultation to help [place] with vent management. I have a feeling she can likely be extubated over the next 24 hours or so.  2. History of diastolic congestive heart failure. Currently she is in decompensated CHF, as mentioned. I will continue Lasix IV twice a day as she is not taking any diuretics at the skilled nursing facility, continue Toprol, continue enalapril and Imdur, cycle her cardiac  markers. Her first set is negative.  3. Hypertension. Continue Toprol, enalapril, Norvasc, and Imdur. Presently hemodynamically stable.  4. Hyperlipidemia. Continue Pravachol. 5. Anxiety/depression. Continue Ativan, thiothixene, Cogentin, and Effexor.  6. Chronic obstructive pulmonary disease. No acute exacerbation. Continue Spiriva. 7. Urinary tract infection, based on abnormal urinalysis. We will start her on IV ceftriaxone, follow urine cultures. 8. Leukocytosis, likely secondary to the UTI, and also stress mediated from her respiratory failure. I will follow her white blood cell count with antibiotic therapy. 9. Hypothyroidism. Continue with Synthroid.  The patient is a DO NOT INTUBATE, DO NOT RESUSCITATE presently. This is discussed with the patient's POA, who is her daughter. She apparently was a DNI, DNR but she did not present to the hospital with the orange form from the skilled nursing facility, therefore, she was urgently intubated.   CRITICAL CARE TIME SPENT: 60 minutes. ____________________________ Belia Heman. Verdell Carmine, MD vjs:slb D: 12/19/2011 10:24:13 ET T: 12/19/2011 10:52:53 ET JOB#: [id number]  cc: Belia Heman. Verdell Carmine, MD, <Dictator> Mikeal Hawthorne. Brynda Greathouse, MD Henreitta Leber MD ELECTRONICALLY SIGNED 12/22/2011 8:17

## 2014-05-15 NOTE — Discharge Summary (Signed)
PATIENT NAME:  Susan Graham, Susan Graham MR#:  [id number] DATE OF BIRTH:  [birth year]  DATE OF ADMISSION:  12/19/2011 DATE OF DISCHARGE:  12/23/2011  For a detailed note, please take a look at the history and physical done on admission.   DIAGNOSES AT DISCHARGE:  1. Acute on chronic respiratory failure secondary to acute decompensated congestive heart failure.  2. Acute congestive heart failure, likely diastolic dysfunction. 3. Leukocytosis secondary to stress mediation and underlying urinary tract infection. 4. Urinary tract infection.  5. Anxiety/depression.  6. Chronic obstructive pulmonary disease. 7. Obstructive sleep apnea. 8. Hypothyroidism.  9. Hyperlipidemia.   DIET: Patient is being discharged on a low sodium, [employer] 1800 calorie low fat diet.   ACTIVITY: As tolerated.   FOLLOW UP: Follow up is in the next 1 to 2 weeks with the primary care physician at the skilled nursing facility. Patient is being discharged to a skilled nursing facility with hospice services.   DISCHARGE MEDICATIONS:  1. Spiriva 1 puff daily.  2. Imdur 30 mg daily.  3. Toprol-XL 100 mg daily.  4. Enalapril 2.5 mg daily.  5. Aspirin 325 mg daily.  6. Amlodipine 10 mg daily.  7. Cogentin 1 mg daily.  8. Iron sulfate 325 mg daily.  9. Synthroid 75 mcg daily.  10. Senokot 1 tab b.i.d.  11. Pravachol 20 mg daily.  12. Pramipexole 0.5 mg at bedtime.  13. Navane 1 cap which is 2 mg at bedtime.  14. Effexor-XR 75 mg at bedtime. 15. Dulcolax suppository as needed.  16. Lorazepam 0.5 mg 1/2 tab to 1 tab every two hours as needed for anxiety. 17. Compazine 10 mg q.4 hours as needed.  18. Lactulose 30 mL daily as needed. 19. Morphine sulfate 5 mL q.2 hours as needed.  20. Lantus 20 units at bedtime.  21. Ceftin 500 mg b.i.d. x3 days. 22. Lasix 40 mg daily.  23. Potassium 10 mEq daily.   [place] COURSE:  1. Dr. Raul Del from pulmonary critical care. 2. Dr. Neoma Laming from cardiology.   LABORATORY, DIAGNOSTIC AND RADIOLOGICAL DATA: Chest x-ray done on admission showing evidence of an endotracheal tube with pulmonary edema.   HOSPITAL COURSE: This is a 69 year old female with medical problems as mentioned above presented to the hospital secondary to shortness of breath and hypoxia and was urgently intubated in the Emergency Room secondary to acute on chronic hypoxic respiratory failure.  1. Acute on chronic respiratory failure. This was likely secondary to acute decompensated congestive heart failure combined with underlying sleep apnea and chronic obstructive pulmonary disease. Patient was apparently severely hypoxic at the skilled nursing facility and when brought to the ER was having more trouble breathing therefore was urgently intubated. Patient was in the Intensive Care Unit for 24 hours. Received aggressive diuresis with IV Lasix. She has now been about 9 to 10  liters negative since admission. She was extubated 24 hours after admission and has been on 2 liters nasal cannula doing well. She currently will be discharged on her maintenance inhalers for her chronic obstructive pulmonary disease as mentioned along with diuretics, beta blockers and ACE inhibitors for her congestive heart failure.  2. Acute congestive heart failure. This was likely secondary to acute on chronic diastolic dysfunction. Patient was not on any diuretics prior to coming into the hospital. She was aggressively diuresed with IV Lasix. As mentioned is about 9 liters negative since admission. Her O2 requirements have come down. She significantly feels much better.  Her lower extremity edema has improved. She currently is being discharged on Lasix p.o. along with beta blockers and ACE inhibitor as stated.  3. Hypokalemia. This was likely secondary to diuresis. Patient will be discharged on potassium supplements and her potassium is improved with supplementation. 4. Chronic obstructive pulmonary  disease. There was no acute chronic obstructive pulmonary disease exacerbation. Patient will continue her Spiriva as stated.  5. Hypothyroidism. Patient was maintained on her Synthroid, she will resume that.  6. Hyperlipidemia. Patient was maintained on her Pravachol, she will resume that.  7. Restless leg syndrome. Patient was maintained on her pramipexole and she will resume that. 8. Diabetes. Initially when patient presented to the hospital she was n.p.o. as she was intubated therefore she was just placed on sliding scale insulin coverage. She was on Humalog 75/25 along with Lantus. For now I am discharging her on low dose Lantus and her sugars can be further followed. She has had no evidence of any hypoglycemia since being in the hospital.  9. Anxiety/depression. Patient is on Effexor, Cogentin and Navane. She will continue that upon discharge.  10. Urinary tract infection. Patient was initially treated with IV ceftriaxone. Her urine cultures have not come back yet. I am discharging her on Ceftin for another three days.  11. CODE STATUS: Patient is a DO NOT INTUBATE/DO NOT INTUBATE. She is being discharged to a skilled nursing facility with hospice services given her multiple comorbidities. The family is in agreement with this plan.   TIME SPENT WITH THE DISCHARGE: 40 minutes.  ____________________________ Belia Heman. Verdell Carmine, MD vjs:cms D: 12/23/2011 10:32:28 ET T: 12/23/2011 10:57:13 ET JOB#: [id number]  cc: Belia Heman. Verdell Carmine, MD, <Dictator> Henreitta Leber MD ELECTRONICALLY SIGNED 12/25/2011 14:58

## 2014-05-15 NOTE — Discharge Summary (Signed)
PATIENT NAME:  Susan Graham, BERISH MR#:  [id number] DATE OF BIRTH:  05/05/1945  DATE OF ADMISSION:  09/24/2011 DATE OF DISCHARGE:  09/25/2011  DISCHARGE DIAGNOSES: 1. Acute on chronic respiratory failure of unclear etiology.  2. Chronic respiratory failure due to chronic obstructive pulmonary disease, on home oxygen. 3. Sleep apnea. 4. Diabetes. 5. Hypertension. 6. Hyperlipidemia. 7. Coronary artery disease status post coronary artery bypass graft. 8. Cerebrovascular accident with right hemiparesis. 9. Hypothyroidism. 10. Chronic diastolic congestive heart failure. 11. Recent prolonged hospitalization with treatment for pneumonia.  12. Anxiety and depression.  13. Restless leg syndrome.  14. Leukocytosis.   DISPOSITION: The patient is being discharged to her rehab facility.   DIET: Low sodium, 1800 calorie ADA diet.   ACTIVITY: As tolerated.   DISCHARGE FOLLOWUP: Followup with primary care physician, Dr. Nicky Pugh, in 1 to 2 weeks after discharge.   DISCHARGE MEDICATIONS:  1. Use automated CPAP at bedtime.  2. Levothyroxine 75 mcg daily.  3. Amlodipine 10 mg daily.  4. Vasotec 2.5 mg daily.  5. Aspirin 325 mg daily. 6. Benztropine 1 mg daily.  7. Hydralazine 25 mg three times daily.  8. Imdur 30 mg daily.  9. Metoprolol extended disease 100 mg daily.  10. Pramipexole 0.5 mg daily. 11. Pravastatin 20 mg daily.  12. Thiothixene 2 mg at bedtime. 13. Venlafaxine 75 mg daily. 14. Plavix 75 mg daily. 15. Spiriva 18 mcg inhaled daily.  16. Zantac 150 mg daily.  17. Advair 250/50 one puff twice a day. 18. Percocet 5/325 mg 1 tablet every four hours p.r.n.  19. Lantus 30 units at bedtime. 20. Ceftin 500 mg twice a day for three days. 21. Ferrous sulfate 325 mg daily.  22. DuoNebs four times daily p.r.n.  RESULTS: Chest x-ray showed improved aeration in the lung bases with some residual atelectasis versus infiltrate, CABG changes are present, and small left pleural effusion  possibly present.   Urinalysis showed no evidence of infection.   White count was 25.8 on admission and 10.1 by the time of discharge. Hemoglobin was 10.5 to 9.4. Normal platelet count. Creatinine was 1.52 on admission and 1.36 by the time of discharge. BNP 2625. Glucose 201. The rest of electrolytes normal. Cardiac enzymes 0.08 to 0.10.   HOSPITAL COURSE: The patient is 69 year old female with past medical history with multiple medical problems who recently had a prolonged hospital course and for pneumonia and was discharged to a rehab facility. She presented with acute shortness of breath. She was admitted with the diagnosis of acute on chronic respiratory failure and was initially on BiPAP and has now been transitioned to her usual oxygen via nasal cannula. The etiology of acute on chronic respiratory failure was unclear. Differential diagnoses include chronic obstructive pulmonary disease, diastolic congestive heart failure, sleep apnea, and pneumonia. However, since admission she states she has had no evidence of wheezing or any evidence of congestive heart failure exacerbation. Her white count was up but she has been afebrile with subsequent normalization of her white count. She was treated with IV antibiotics initially for possible pneumonia. However, they are being discontinued at the time of discharge. She has been advised to use CPAP at night. She will continue Spiriva Advair, and nebulizer treatments. Her diabetes and hypertension remained well controlled. Her kidney function improved and her leukocytosis has resolved. She is a LIMITED CODE. She does not want any CPR, defibrillation, or cardioversion. She is okay with intubation, mechanical ventilation, if it is due to chronic obstructive  pulmonary disease exacerbation. She is also okay with vasoactive drugs and antiarrhythmic medications.   TIME SPENT: 45 minutes.  ____________________________ Cherre Huger, MD sp:slb D: 09/25/2011 10:32:01  ET T: 09/25/2011 10:53:58 ET JOB#: [id number]  cc: Cherre Huger, MD, <Dictator> Cherre Huger MD ELECTRONICALLY SIGNED 09/25/2011 15:21

## 2014-05-15 NOTE — Consult Note (Signed)
PATIENT NAME:  Susan Graham, Susan Graham MR#:  [id number] DATE OF BIRTH:  04-09-1945  DATE OF CONSULTATION:  09/10/2011  REFERRING PHYSICIAN:   CONSULTING PHYSICIAN:  Allyne Gee, MD  REASON FOR CONSULTATION: Acute respiratory failure.   HISTORY OF PRESENT ILLNESS: This is a 69 year old white female with multiple medical problems. She has a history of coronary artery disease status post coronary artery bypass graft, diastolic heart failure, and chronic obstructive pulmonary disease. She came into the hospital because she was having decreased responsiveness and had increased shortness of breath. The patient was seen by EMS out in the field and was started apparently on CPAP. Saturations did improve a little bit, but then she came into the Emergency Room. However, she was still having difficulty with her breathing and she was intubated. The patient is now on the ventilator. This morning was switched over to spontaneous mode with a pressure support of 15 and seems to be tolerating that fairly well. A CT scan was done today and the CT scan shows significant consolidation with pleural effusion on the right side as well as pleural effusion on the left side. The patient had some air bronchograms but for the most part the lung appears to be pretty densely consolidated.   PAST MEDICAL HISTORY:  1. Coronary artery disease.  2. Diastolic heart failure. 3. Chronic obstructive pulmonary disease.  4. Hypertension.  5. Diabetes. 6. Morbid obesity. 7. Pseudotumor cerebra.  8. Hypothyroidism. 9. Restless leg syndrome.  10. Sleep apnea hypopnea syndrome.   PAST SURGICAL HISTORY: Significant for what is noted above. In addition hysterectomy and left knee surgery.   ALLERGIES: Levaquin, sulfa, codeine, and Risperdal.   MEDICATIONS: Reviewed on the electronic medical record at this time.   SOCIAL HISTORY: Positive for tobacco use up until what looks like 2008. No alcohol use.   FAMILY HISTORY: Positive for  congestive heart failure and coronary artery disease.  REVIEW OF SYSTEMS: She is not able to provide because she is orally intubated.   PHYSICAL EXAMINATION:   GENERAL: At the time that she was seen, she was on the vent. She opens eyes but is still somewhat sedated.   VITAL SIGNS: Temperature 98.4, pulse 58, respiratory rate 13, blood pressure 152/58 and saturations were 100%.   EYES: Extraocular movements could not really be assessed as she was orally intubated.   NECK: Neck appeared to be supple. No JVD. No adenopathy.   CARDIOVASCULAR: S1 and S2 normal. Regular rhythm. No gallop or rub.   ABDOMEN: Soft and nontender. No rebound or rigidity was noted.   EXTREMITIES: No cyanosis or clubbing. Pulses equal.  NEUROLOGIC: As already noted above.   LABORATORY, DIAGNOSTIC AND RADIOLOGIC DATA: CT scan results as already noted above.   White count was 18.3, hemoglobin 10, and hematocrit 32.2. The patient's CPK was 269, MB was 10.5, and troponin was elevated at 1.4.   The ABG that was drawn at 5:00 this morning was 7.32/48/65.   BUN is 34, creatinine 1.48, sodium 139, and potassium 4.7.  Chest x-ray as already noted.   IMPRESSION:  1. Acute on chronic respiratory failure.  2. Acute chronic obstructive pulmonary disease exacerbation.  3. Pneumonia.  4. Sleep apnea hypopnea syndrome.  5. Positive cardiac enzymes suggestive of a non- STEMI.  PLAN: As far as the respiratory failure is concerned, she seems to be doing relatively well on spontaneous mode. Her pressure support is at 15. I would suspect that we could probably wean it down, however, she  does have a significant pneumonia and septicemia secondary to the pneumonia and I would not want to try to extubate her today but awe can certainly relook at things in the morning. Also, there is a small effusion on the right side probably not enough to tap.  If respiratory mechanics become difficult, then I would consider doing an ultrasound to  locate the fluid and perform thoracentesis to help improve mechanics. As far as her cardiac status is concerned, cardiology consultation has been obtained. For sepsis she is on antibiotics. Would also continue with DVT prophylaxis and some inhalers in the form of Combivent and Flovent and will monitor and discuss case in rounds with respiratory therapy. Further recommendations as necessary.  ____________________________ Allyne Gee, MD sak:slb D: 09/10/2011 13:43:05 ET T: 09/10/2011 14:34:23 ET JOB#: [id number]  cc: Allyne Gee, MD, <Dictator> Allyne Gee MD ELECTRONICALLY SIGNED 09/16/2011 11:46

## 2014-05-15 NOTE — Consult Note (Signed)
PATIENT NAME:  Susan Graham, Susan Graham MR#:  [id number] DATE OF BIRTH:  Oct 23, 1945  DATE OF CONSULTATION:  09/12/2011  REFERRING PHYSICIAN:  Dr Verdell Carmine.   CONSULTING PHYSICIAN:  Corey Skains, MD  PRIMARY CARE PHYSICIAN: Dr. Brynda Greathouse.   REASON FOR CONSULTATION: Acute elevation of troponin with known coronary artery disease, hypertension, hyperlipidemia, diastolic dysfunction, congestive heart failure, and sleep apnea.   CHIEF COMPLAINT: Patient is not significantly responsive at this time able to give history.   HISTORY OF PRESENT ILLNESS: This is a 69 year old female with known coronary artery disease status post previous coronary artery bypass graft, diastolic dysfunction, congestive heart failure, chronic sleep apnea with chronic obstructive pulmonary disease, diabetes mellitus, hypertension, hyperlipidemia.  The patient has had appropriate treatment with appropriate medications listed and has been stable with this issue. She had a new onset of the patient being unresponsive and unable to communicate with some low blood pressure. The patient was treated in the emergency room for probable acute respiratory failure with sepsis. The patient did get some intravenous fluids, oxygen, and was treated with appropriate intubation. She was extubated without any further issue and has had no evidence of significant rhythm disturbances. The patient has had laboratory work consistent with urinary tract infection with some chronic kidney disease and renal insufficiency with a creatinine of 1.48, some mild anemia with a hemoglobin of 9.9, and elevated troponin of 1.29. This is more consistent with demand ischemia rather than acute coronary syndrome The patient now is much more hemodynamically stable with no further significant issues. The remainder of review of systems cannot be assessed due to the patient's inability to communicate well today.   PAST MEDICAL HISTORY:  1. Obstructive sleep apnea with oxygen requirement.   2. Coronary artery disease, status post coronary artery bypass graft 2008. 3. Diastolic dysfunction/congestive heart failure.  4. Hypertension.  5. Diabetes mellitus.  6. Cerebrovascular accident after coronary artery bypass surgery.  7. Hypertension. 8. Diabetes mellitus.   FAMILY HISTORY: From records, congestive heart failure in her mother, coronary artery disease in her father and two brothers.   SOCIAL HISTORY: Apparently patient has remote tobacco use. Currently denies alcohol or tobacco use.   ALLERGIES: As listed.   PHYSICAL EXAMINATION:  VITAL SIGNS: Blood pressure 100/60 bilaterally, heart rate is 72 and regular.   GENERAL: She is a well-appearing female in no acute distress.   HEENT: No icterus, thyromegaly, ulcers, hemorrhage, or xanthelasma.   HEART: Regular rate and rhythm with normal S1 and S2. A 2.6 apical murmur consistent with mitral regurgitation. Point of maximal impulse is diffuse. Carotid upstroke normal without bruit. Jugular venous pressure is normal.   LUNGS: Lungs have bibasilar crackles and expiratory wheezes.   ABDOMEN: Soft, nontender, no apparent hepatosplenomegaly or masses.   EXTREMITIES: 2+ bilateral pulses in dorsal, pedal, radial, and femoral arteries with 1+ lower extremity edema. No, cyanosis, clubbing, or ulcers.   NEUROLOGIC: The patient still is somewhat obtunded.   ASSESSMENT: A 69 year old female with known coronary disease status post coronary artery bypass graft, diastolic dysfunction, congestive heart failure, sleep apnea, chronic obstructive pulmonary disease, diabetes mellitus, hypertension, hyperlipidemia with elevated troponin consistent with demand ischemia and sepsis, slightly improved today.   RECOMMENDATIONS:  1. Continue serial ECG and enzymes to assess for possible extent of myocardial infarction versus continued demand ischemia. 2. Further treatment with antibiotics and other treatments of sepsis with supportive care and  oxygenation.  3. CPAP machine and/or oxygenation as necessary for sleep apnea.  4. Echocardiogram  for LV systolic dysfunction, valvular heart disease and diastolic dysfunction, and adjustments of medications as necessary.  5. Continue to further reinstate medications as patient recovers from sepsis including diabetes, hypertension, hyperlipidemia controlled with previous medications.  6. Slow rehabilitation. 7. Further cardiac diagnostics necessary after above.    ____________________________ Corey Skains, MD bjk:vtd D: 09/12/2011 08:23:08 ET T: 09/12/2011 10:03:26 ET JOB#: [id number]  cc: Corey Skains, MD, <Dictator> Corey Skains MD ELECTRONICALLY SIGNED 09/16/2011 7:44

## 2014-05-18 DIAGNOSIS — I5032 Chronic diastolic (congestive) heart failure: Secondary | ICD-10-CM

## 2014-05-18 DIAGNOSIS — J449 Chronic obstructive pulmonary disease, unspecified: Secondary | ICD-10-CM

## 2014-05-18 DIAGNOSIS — F319 Bipolar disorder, unspecified: Secondary | ICD-10-CM

## 2014-05-18 DIAGNOSIS — I2510 Atherosclerotic heart disease of native coronary artery without angina pectoris: Secondary | ICD-10-CM

## 2014-05-18 DIAGNOSIS — E119 Type 2 diabetes mellitus without complications: Secondary | ICD-10-CM

## 2014-05-20 NOTE — Discharge Summary (Signed)
PATIENT NAME:  Susan Graham, Susan Graham MR#:  [id number] DATE OF BIRTH:  [birth year]  DATE OF ADMISSION:  01/25/2011 DATE OF DISCHARGE:  02/02/2011  ADDENDUM  I am discharging her on 2 liters of oxygen. She is saturating 96%. This can be weaned down at rehab facility.   ____________________________ Mena Pauls, MD ag:cms D: 02/02/2011 13:49:10 ET T: 02/02/2011 13:53:14 ET JOB#: [id number]  cc: Mena Pauls, MD, <Dictator> [place] MD ELECTRONICALLY SIGNED 02/17/2011 17:36

## 2014-05-20 NOTE — H&P (Signed)
PATIENT NAME:  Susan Graham, Susan Graham MR#:  [id number] DATE OF BIRTH:  Mar 01, 1945  DATE OF ADMISSION:  08/02/2011  ED REFERRING PHYSICIAN: Dr. Benjaman Lobe  PRIMARY CARE PHYSICIAN: Dr. Brynda Greathouse   CHIEF COMPLAINT: Shortness of breath and not improving since discharge.  HISTORY OF PRESENT ILLNESS: This is a 69 year old Caucasian female with past medical history of diastolic CHF, COPD, sleep apnea, and coronary artery disease who presents with progressive shortness of breath from recent admission. The patient was recently admitted on June 26th and discharged on June 30th for shortness of breath and left lower lobe pneumonia. She was given steroids, azithromycin, IV Rocephin, and IV Lasix. At that time she was found to have acute on chronic diastolic heart failure. She was seen by Dr. Clayborn Bigness who felt that she just had demand ischemia from COPD worsening her diastolic heart failure. She was also noted to have elevated troponin at that time which was trending down at the time of discharge. Her family brings her back today because they state she has not improved since her discharge; she is the same as she was when she came into the hospital the first time on June 26th and she is still not able to get up and walk around or go back to her baseline quality of life prior to her first hospitalization so they decided to bring her back in for further workup and evaluation. In the ED, she was noted to still have bilateral small pleural effusion on chest x-ray and still had some residual left lower lobe pneumonia. She was also noted to still have a white cell count of about 23,000. Hospitalist services were consulted for further inpatient workup and management.   PAST MEDICAL HISTORY: 1. Coronary artery disease, status post CABG in 2008 at [facility]. 2. History of diastolic CHF with ejection fraction of 55%. 3. History of COPD. 4. Poorly controlled systemic hypertension. 5. History of diabetes, type II. 6. Morbid  obesity. 7. History of pseudotumor cerebri. 8. History of bipolar/anxiety disorder. 9. 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 10. Hypothyroidism. 11. Gastroesophageal reflux disease. 12. History of smoking. 13. History of restless leg syndrome. 14. Sleep apnea, inconsistent use of CPAP at night. She was diagnosed in 2007 with obstructive sleep apnea and has not used it consistently.  PAST SURGICAL HISTORY: 1. Status post CABG. 2. Status post hysterectomy. 3. Status post left knee surgery.   ALLERGIES: Codeine, Levaquin, Risperdal, and sulfa.   CURRENT MEDICATIONS:  1. Lantus 70 units daily. 2. Lasix 20 mg p.o. twice a day.  3. Albuterol nebulizer. 4. Potassium chloride 10 mEq p.o. daily. 5. Oxygen 2 liters through nasal cannula. 6. Zithromax prescription which she has completed. 7. Levothyroxine 75 mcg p.o. daily. 8. Amlodipine 10 mg daily. 9. Vasotec 2.5 mg daily. 10. Plavix 75 mg daily. 11. Advair 250/50 one puff every twelve hours.  12. Aspirin 325 mg p.o. daily. 13. Benztropine 1 mg p.o. daily. 14. Hydralazine 25 mg p.o. t.i.d. 15. Isosorbide mononitrate 30 mg p.o. daily. 16. Metoprolol 100 mg p.o. daily. 17. Pramipexole 0.5 mg once a day. 18. Pravastatin 20 mg p.o. daily. 19. Spiriva 18 mcg daily. 20. Percocet 5/325 every four hours p.r.n. 21. Thiothixene 2 mg at bedtime. 22. Venlafaxine 75 mg p.o. daily.  SOCIAL HISTORY: Used to smoke, quit in 2008. Prior smoked a pack a day for 25 years. No history of alcohol or drug use.   FAMILY HISTORY: Mother suffered from congestive heart failure and  father and two brothers with coronary artery disease status post CABG.  REVIEW OF SYSTEMS: No fevers. She does complain of some fatigue, weakness, not sure of any weight gain. Has chronic pain. EYES: Denies any blurred vision or double vision. No redness. No inflammation. No glaucoma. No cataracts. ENT: No tinnitus, ear pain, hearing loss, seasonal  allergies. No discharge. No difficulty swallowing. RESPIRATORY: No cough. No wheezing. Does have history of COPD. Has chronic dyspnea. CARDIOVASCULAR: Denies any chest pain or chest tenderness. Has edema in bilateral lower extremities. No arrhythmias. No palpitations. No syncope. GI: No nausea, vomiting, diarrhea. No abdominal pain. No hematemesis. No melena. No ulcer. No gastroesophageal reflux disease. No irritable bowel syndrome. No jaundice. GU: Denies any dysuria, hematuria, renal calculi, or frequency. ENDOCRINE: Denies any polydipsia, nocturia, or thyroid problems. HEME/LYMPH: Denies anemia, easy bruising, or swollen glands. SKIN: No acne, rash, lesions, changes in mole, hair or skin. MUSCULOSKELETAL: Has pain related to osteoarthritis. No gout. NEUROLOGIC: Has some right-sided weakness as a result of previous CVA. No seizures. PSYCHIATRIC: Positive for anxiety but no insomnia or ADD.   PHYSICAL EXAMINATION:  VITAL SIGNS WHILE IN THE ED: Blood pressure 121/48, temperature 96.3, respirations 22, pulse 47, oxygen sats 100% on 2 liters nasal cannula.   GENERAL: The patient is an obese Caucasian female in no acute respiratory distress.   HEENT: Head is atraumatic, normocephalic. Pupils equal, round, and reactive to light and accommodation. Nasopharynx reveals no pallor. Nasal exam shows no drainage or ulceration. Oropharynx is clear without any exudates.  NECK: No JVD. No carotid bruits. No thyromegaly.   CARDIOVASCULAR: Regular rate, regular rhythm. No murmurs, rubs, clicks, or gallops. PMI is not displaced. There is 1+ pitting edema noted in the bilateral lower extremities extending midway up the shins of her legs.   LUNGS: She has crackles at the bilateral base, has some rhonchi. Otherwise, good airway sounds and coarse upper airway sounds.   ABDOMEN: Soft, nontender, and nondistended. Positive bowel sounds. Obese abdomen.  EXTREMITIES: 1+ edema in bilateral lower extremities extending up to  the mid legs.   SKIN: No rash.  LYMPHATIC: No lymph nodes palpable.  NEUROLOGIC: Cranial nerves II through XII intact. Good deep tendon reflexes. She does have some mild right-sided upper and lower extremity weakness, 3 out of 5.   LABORATORY, DIAGNOSTIC, AND RADIOLOGICAL DATA: Sodium 136, potassium 4.9, chloride 103, bicarb 28, BUN 48, creatinine 1.87, glucose 58. Troponin 0.11. CK-MB 1.7. CK total 37. She did have a CBC that showed a white cell count of 23.9, hemoglobin 11.5, hematocrit 37.4, platelet count 355. She did have a urinalysis that showed 1+ leukocyte esterase, 18 white blood cells per high-power field.   CT of the head for confusion showed no acute intracranial process.   Chest x-ray showed bilateral diffuse interstitial thickening. There may be trace pleural effusion. There is no pneumothorax. Heart size is normal. There is evidence of prior CABG. Osseous structures are unremarkable. Findings are most consistent with pulmonary edema.   EKG shows sinus bradycardia, mild left axis deviation, heart rate of about 50, otherwise no acute ST-T wave changes are noted.   ASSESSMENT AND PLAN: This is a 69 year old female with multiple hospitalizations here for shortness of breath, CHF who was seen here last week and had a recent admission with negative CT for pulmonary embolus who did get treatment for COPD exacerbation, acute on chronic diastolic heart failure discharged home and now coming back because of no improvement since her discharge.  1.  Shortness of breath, likely multifactorial etiology given diastolic heart failure, COPD, OSA, deconditioning, probable pneumonia. At this point will continue with IV Lasix. She did get one dose in the ED. Will assess her for p.r.n. Lasix on a daily basis. Continue with albuterol nebs. Will hold steroids for now. Will treat as HCAP with vanc and Zosyn. Evaluate for IV Lasix on a daily basis given her elevated creatinine. Will also consult physical  therapy. If no improvement in 1 to 2 days with her breathing, would consider repeat chest CT and possibly Pulmonary consult for further workup and management. Will follow her blood cultures, urine cultures, and also obtain a sputum culture. 2. Chronic diastolic heart failure. Again, will give her IV Lasix on a p.r.n. basis. She did receive 80 mg of Lasix in the ED. Will monitor urine output and close monitoring of her creatinine and BUN. 3. Stage III CKD. The patient has had creatinine that fluctuates from 2.3 to normal in the past. Will follow renal function closely. If worsens, will get a Nephrology evaluation. 4. Leukocytosis, possibly due to pneumonia. Will continue with IV antibiotics as stated above. 5. Diabetes, type II. Currently has been running blood sugars that have been low for her. Will half her Lantus at this time to 35 units, assess her eating and put her on insulin sliding scale. 6. OSA (sleep apnea). Will continue with CPAP at bedtime. The patient was counseled on risks, benefits, and uses of using an automated CPAP at night and risks of not using it.  7. Hypothyroidism. Continue with levothyroxine. 8. Elevated troponin. Has a history of coronary artery disease. Likely due to chronic renal failure, CAD, demand ischemia from COPD and CAD. It is currently trending down from her last admission. Will continue trending her troponins.  CODE STATUS: The patient is a FULL CODE.  TIME SPENT DICTATING AND EVALUATING THE PATIENT: 50 minutes.   ____________________________ Vilinda Boehringer, MD vm:drc D: 08/02/2011 21:31:31 ET T: 08/03/2011 08:09:38 ET JOB#: [id number]  cc: Vilinda Boehringer, MD, <Dictator> Mikeal Hawthorne. Brynda Greathouse, MD Vilinda Boehringer MD ELECTRONICALLY SIGNED 08/03/2011 15:02

## 2014-05-20 NOTE — H&P (Signed)
PATIENT NAME:  Susan Graham, Susan Graham MR#:  [id number] DATE OF BIRTH:  12-17-1945  DATE OF ADMISSION:  01/25/2011  ADDENDUM:  ASSESSMENT AND PLAN: Acute respiratory failure. Given the patient's symptoms of shortness of breath, subjective fevers as well as productive cough, will also check serum Influenza A and B antigens which have been ordered stat in the ER. Will await results.  ____________________________ Romie Jumper, MD knl:drc D: 01/25/2011 09:03:47 ET T: 01/25/2011 11:49:32 ET JOB#: [id number]  cc: Romie Jumper, MD, <Dictator> Mikeal Hawthorne Brynda Greathouse, MD Dwayne D. Clayborn Bigness, MD Romie Jumper MD ELECTRONICALLY SIGNED 01/29/2011 16:25

## 2014-05-20 NOTE — Consult Note (Signed)
Chief Complaint:   Subjective/Chief Complaint SOB Dyspnea leg edema. She c/o of weakness and fatigue.   VITAL SIGNS/ANCILLARY NOTES: **Vital Signs.:   28-Jun-13 12:21   Vital Signs Type Routine   Temperature Temperature (F) 98.2   Celsius 36.7   Temperature Source Oral   Pulse Pulse 75   Pulse source per vital sign device   Respirations Respirations 20   Systolic BP Systolic BP 967   Diastolic BP (mmHg) Diastolic BP (mmHg) 73   Mean BP 100   BP Source vital sign device   Pulse Ox % Pulse Ox % 94   Pulse Ox Activity Level  At rest   Oxygen Delivery 3L  *Intake and Output.:   28-Jun-13 11:00   Grand Totals Intake:  333.8 Output:      Net:  333.8 24 Hr.:  573.8   Oral Intake      In:  237   IV (Primary)      In:  96.8   Urinary Method  Incontinent; x3   Brief Assessment:   Cardiac Irregular  murmur present  + carotid bruits  + LE edema  --Gallop    Respiratory normal resp effort    Gastrointestinal Normal    Gastrointestinal details normal Soft  Nondistended   Lab Results: Routine Chem:  28-Jun-13 07:00    Glucose, Serum  410   BUN  59   Creatinine (comp)  1.72   Sodium, Serum  130   Potassium, Serum 5.0   Chloride, Serum  94   CO2, Serum 25   Calcium (Total), Serum 9.1   Anion Gap 11   Osmolality (calc) 295   eGFR (African American)  35   eGFR (Non-African American)  30 (eGFR values <5m/min/1.73 m2 may be an indication of chronic kidney disease (CKD). Calculated eGFR is useful in patients with stable renal function. The eGFR calculation will not be reliable in acutely ill patients when serum creatinine is changing rapidly. It is not useful in  patients on dialysis. The eGFR calculation may not be applicable to patients at the low and high extremes of body sizes, pregnant women, and vegetarians.)  Routine UA:  28-Jun-13 12:30    Color (UA) Yellow   Clarity (UA) Cloudy   Glucose (UA) 50 mg/dL   Bilirubin (UA) Negative   Ketones (UA) Negative    Specific Gravity (UA) 1.009   Blood (UA) Negative   pH (UA) 5.0   Protein (UA) 30 mg/dL   Nitrite (UA) Negative   Leukocyte Esterase (UA) Negative (Result(s) reported on 24 Jul 2011 at 01:16PM.)   RBC (UA) <1 /HPF   WBC (UA) 1 /HPF   Bacteria (UA) TRACE   Epithelial Cells (UA) 10 /HPF   Mucous (UA) PRESENT (Result(s) reported on 24 Jul 2011 at 01:16PM.)  Routine Coag:  28-Jun-13 01:05    Activated PTT (APTT)  < 23.0 (A HCT value >55% may artifactually increase the APTT. In one study, the increase was an average of 19%. Reference: "Effect on Routine and Special Coagulation Testing Values of Citrate Anticoagulant Adjustment in Patients with High HCT Values." American Journal of Clinical Pathology 2006;126:400-405.)    07:00    Activated PTT (APTT)  49.5 (A HCT value >55% may artifactually increase the APTT. In one study, the increase was an average of 19%. Reference: "Effect on Routine and Special Coagulation Testing Values of Citrate Anticoagulant Adjustment in Patients with High HCT Values." American Journal of Clinical Pathology 2006;126:400-405.)  Routine Hem:  28-Jun-13  07:00    WBC (CBC)  21.1   RBC (CBC) 4.11   Hemoglobin (CBC)  10.3   Hematocrit (CBC)  33.6   Platelet Count (CBC) 319   MCV 82   MCH  25.2   MCHC  30.8   RDW  16.8   Neutrophil % 86.6   Lymphocyte % 7.6   Monocyte % 4.4   Eosinophil % 0.3   Basophil % 1.1   Neutrophil #  18.2   Lymphocyte # 1.6   Monocyte # 0.9   Eosinophil # 0.1   Basophil #  0.2 (Result(s) reported on 24 Jul 2011 at 08:10AM.)   Assessment/Plan:  Invasive Device Daily Assessment of Necessity:   Does the patient currently have any of the following indwelling devices? none   Assessment/Plan:   Assessment IMP CHF SOB Pneumonia HTN CAD DM PVD CVA Hyperlipidemia Obesity OSA    Plan PLAN 02 Antibx IV Continue Bp control Agree with statins Inhales for congestion CPAP for OSA Increase activity Rehab prior to  d/c Agree with tele F/U renal function   Electronic Signatures: Lujean Amel D (MD)  (Signed 29-Jun-13 11:36)  Authored: Chief Complaint, VITAL SIGNS/ANCILLARY NOTES, Brief Assessment, Lab Results, Assessment/Plan   Last Updated: 29-Jun-13 11:36 by Lujean Amel D (MD)

## 2014-05-20 NOTE — Consult Note (Signed)
Brief Consult Note: Diagnosis: major depression.   Patient was seen by consultant.   Consult note dictated.   Comments: Psychiatry: Patient seen. Also spoke with daughter and her boyfriend and reviewed chart. Although patient will say she has worse depression at times and even said she had some suicidal thoughts, she is an inconsistant historian. The daughter tells me that she sees her mother currently being at baseline and implores me not to change her medication. She says the current meds are the best her mother has ever taken. I will not make any changes at this time but will continue to follow up.  Electronic Signatures: Gonzella Lex (MD)  (Signed 03-Jan-13 16:58)  Authored: Brief Consult Note   Last Updated: 03-Jan-13 16:58 by Gonzella Lex (MD)

## 2014-05-20 NOTE — Discharge Summary (Signed)
PATIENT NAME:  Susan Graham, Susan Graham MR#:  [id number] DATE OF BIRTH:  Dec 20, 1945  DATE OF ADMISSION:  01/25/2011 DATE OF DISCHARGE:  02/02/2011  DIAGNOSES:  1. Acute hypoxic respiratory failure, multifactorial, secondary to suspected chronic obstructive pulmonary disease exacerbation.  2. Acute bronchitis.  3. Acute diastolic failure.  4. Malignant hypertension.  5. Elevated troponins most likely secondary to congestive heart failure and respiratory failure  6. History of coronary artery disease with previous coronary artery bypass graft. 7. Acute renal failure, improved, suspect chronic kidney disease. 8. Diabetes. 9. History of cerebrovascular accident. 10. Obstructive sleep apnea, on CPAP. 11. Bipolar disorder. 12. Anxiety. 13. Restless leg syndrome.  14. Hypothyroidism.  15. History of treatment for pseudotumor cerebri.   CONSULTS:  1. Cardiology, Dr. Clayborn Bigness  2. Nephrology, Dr. Candiss Norse  3. Psychiatry, Dr. Mikki Harbor COURSE: This is a 69 year old female who has multiple medical problems. She has history of coronary artery disease, diabetes, hypertension, hyperlipidemia, bipolar disorder, anxiety, history of cerebrovascular accident, and hypothyroidism. She presented with shortness of breath. Please refer to previous interim discharge summary by Dr. Royden Purl on 01/30/2011. The patient was initially admitted as acute respiratory failure secondary to acute diastolic failure, malignant hypertension, elevated troponin secondary to congestive heart failure, and also she was in acute renal failure. It was also found that the patient had acute bronchitis. When she initially came in, her chest x-ray was essentially negative. Her BNP was in the range of 1646. Her troponin was in the range of 0.16. Her creatinine was 1.57. Baseline creatinine was not known. Her troponins went up to 0.29 with a CK of 129 with an MB of 4. The patient is on aspirin Plavix, statin, and beta-blocker. Cardiology saw her  and thought that elevated troponins were mainly demand ischemia from her acute respiratory failure. He suspected diastolic dysfunction. He suggested CPAP for sleep apnea. Initially she was on BiPAP but that was changed to CPAP. She was given IV diuresis initially but that was stopped because of her worsening renal function. Her echocardiogram showed left ventricle not well visualized, left ventricle grossly normal, no thrombus, EF greater than 55%, mild LV hypertrophy, right ventricular function is normal. The patient was started on IV ceftriaxone. Zithromax was given and DuoNebs. She was also started on albuterol, Advair, and Spiriva. Suspected of chronic obstructive pulmonary disease exacerbation though she does not have a formal diagnosis of chronic obstructive pulmonary disease. She needs PFTs as outpatient. She was given steroids and then changed to prednisone taper. Her shortness of breath has significantly improved right now. She is saturating 96% on 2 liters nasal cannula. Her chest is clear. She is completing a prednisone taper. She had some azotemia secondary to her steroids. Her last BUN was 80 and this should be repeated once the steroids are off. Her peak creatinine increased to 2.02 on 01/29/2011. Her diuretics were stopped. She was also on hydrochlorothiazide and Diovan. That was stopped because of the acute renal failure. She was continued on hydralazine, nitrates, Norvasc, and metoprolol. I'm going to discharge her on Norvasc, metoprolol, hydralazine, and Imdur and will continue to hold her Diovan and Lasix. Nephrology was consulted. She had an ultrasound of the kidneys done which showed that the patient has an atrophic right kidney. No hydronephrosis. We suspect the patient has some chronic kidney disease. Her creatinine at discharge has improved to 1.49 with a potassium of 4.5. Her sugars also have been running high. She takes NovoLog 70/30 in the morning and  her Lantus has been increased to 65  units at bedtime. We also placed her on NovoLog sliding scale. Her sugars may improve after the steroids are completed and then her Lantus maybe decreased after the steroids are completed. When she came in, her white count was normal at 10.9, hemoglobin 13.4. That remained stable. She had a 24-hour urinary creatinine done that showed creatinine clearance of 58. Her urinalysis when she came in was nitrite and leukocyte esterase negative. Her blood cultures were negative. She had serologies done. PTH is normal at 42, 24 hour urea nitrogen slightly high at 26. Influenza A and B negative. Her TSH when she came in was in the range of 6, high, so Synthroid was increased to 75 mcg daily. Free thyroxine was normal at 1.24. This can be repeated as outpatient. She was slightly hyponatremic most likely secondary to hydrochlorothiazide. Her ABG when she came in showed a pH of 7.34, pCO2 45, pO2 419 on 100% FiO2. That was on BiPAP. Repeat chest x-ray on January 1st showed some mild interstitial pulmonary opacities secondary to atelectasis from lung volumes, questionable edema. She is not on ACE or angiotensin receptor blocker right now because of her renal failure. This can be restarted as outpatient if her creatinine stabilizes. She was also seen by Psychiatry during the hospital stay because of her history of bipolar disorder. They suggested no change to her antidepressant medications and her mood actually had improved after the Solu-Medrol was changed to prednisone.   MEDICATIONS AT DISCHARGE:  1. NovoLog Mix 70/30 50 units sub-Q once daily in the morning.  2. Metoprolol extended-release 100 mg daily.  3. Pravastatin 20 mg at bedtime.  4. Thiothixene 2 mg once daily at bedtime.  5. Effexor-XR 75 mg at bedtime. 6. Omeprazole 20 mg daily.  7. Aspirin 325 mg daily. 8. Benztropine 1 mg daily.  9. Plavix 75 mg daily.  10. Percocet 5/325 1 to 2 tabs q.6 hours as needed for pain. 11. Pramipexole 5 mg once daily at  bedtime. 12. Change levothyroxine to 75 mcg once daily.  13. Change amlodipine to 10 mg p.o. once daily. 14. Imdur 30 mg p.o. once daily. 15. Hydralazine 25 mg p.o. t.i.d.  16. Change Lantus insulin to 65 units sub-Q at bedtime. 17. NovoLog sliding scale. 18. Prednisone taper 30 mg p.o. once daily for one day, then 20 mg p.o. once daily for one day, then 10 mg p.o. once daily for one day and then discontinue.  19. Spiriva HandiHaler one puff once a day. 20. Advair Diskus 250/50 1 puff b.i.d.  21. Albuterol MDI 2 puffs q.4 to 6 as needed for wheezing or shortness of breath. 22. Albuterol DuoNebs q.4 to 6 hours as needed for wheezing when not using albuterol MDI. 23. CPAP at bedtime.   CONDITION AT DISCHARGE: T-max 98.1, heart rate 84, blood pressure 164/71, saturating 96% on two liters, 93% on room air. CHEST is clear. HEART sounds are regular. ABDOMEN soft, nontender. She is obese.   DO NOT TAKE: Losartan/hydrochlorothiazide right now.   DIET: Advised a low sodium 1800 calorie ADA, low cholesterol diet.   ACTIVITY: Physical therapy at short-term rehab.  FOLLOW-UP:  1. Follow-up with MD at short-term rehab with Dr. Brynda Greathouse. 2. Follow-up BMP in one week. 3. Follow-up with Dr. Candiss Norse at [facility] in 3 to 4 weeks. 4. Follow-up with Dr. Clayborn Bigness in two weeks. 5. Please make an appointment with Dr. Devona Konig, pulmonologist, for obstructive sleep apnea and  possible chronic obstructive pulmonary disease to evaluate with pulmonary function tests.   TIME SPENT WITH DISCHARGE: 60 minutes.   ____________________________ Mena Pauls, MD ag:drc D: 02/02/2011 13:36:45 ET T: 02/02/2011 13:55:59 ET JOB#: [id number]  cc: Mena Pauls, MD, <Dictator> [place] Brynda Greathouse, [employer] Murlean Iba, MD Dwayne D. Clayborn Bigness, MD Allyne Gee, MD Mena Pauls MD ELECTRONICALLY SIGNED 02/17/2011 17:38

## 2014-05-20 NOTE — Discharge Summary (Signed)
PATIENT NAME:  Susan Graham, Susan Graham MR#:  [id number] DATE OF BIRTH:  09/28/1945  DATE OF ADMISSION:  03/25/2011 DATE OF DISCHARGE:  03/26/2011  DISCHARGE DIAGNOSES:  1. Acute onset of shortness of breath possibly due to diastolic congestive heart failure exacerbation.  2. Leukocytosis.  3. Chronic kidney disease, stage III.  4. Hypertension. 5. Coronary artery disease status post coronary artery bypass graft. 6. Diabetes. 7. Chronic obstructive pulmonary disease. 8. Hypothyroidism. 9. Gastroesophageal reflux disease. 10. Anxiety/bipolar disorder.   DISPOSITION: The patient is being discharged home with home health RN and physical therapy.   DIET: Low sodium, 1800 calorie ADA diet.   ACTIVITY: As tolerated.  DISCHARGE FOLLOWUP: Followup with primary care physician, Dr. Nicky Pugh, in 1 to 2 weeks after discharge.   DISCHARGE MEDICATIONS:  1. NovoLog 70/30, 50 units once a day. 2. Lopressor 100 mg daily.  3. Pravachol 20 mg daily.  4. Thiothixene 2 mg at bedtime. 5. Aspirin 325 mg daily.  6. Benztropine 1 mg daily.  7. Pramipexole 5 mg at bedtime. 8. Spiriva 18 mcg inhaled daily.  9. Advair 250/50 one puff twice a day. 10. Lantus 65 units at bedtime. 11. Levothyroxine 75 mcg daily.  12. Amlodipine 10 mg daily.  13. Plavix 75 mg daily.  14. Vasotec 2.5 mg daily.  15. Hydralazine 25 mg three times daily. 16. ISO-AB 30 mg daily. 17. Venlafaxine 75 mg daily.  18. Percocet 5/325 mg one tablet every 4 hours.  RESULTS: Initial chest x-ray showed low-grade congestive heart failure, increased density on the left reflecting developing infiltrate or atelectasis.   Repeat chest x-ray, PA and lateral, showed improving atelectasis in the right perihilar region and in the left lower lobe.   CBC normal other than white count of 16.4. Creatinine ranging from 1.79 to 1.65. BNP 960. Glucose 133. The rest of the complete metabolic panel normal. Cardiac enzymes negative.   HOSPITAL COURSE:  The patient is a 69 year old female with past medical history of diastolic congestive heart failure, chronic obstructive pulmonary disease, diabetes, hypertension, and bipolar disorder who presented with shortness of breath. The etiology of the patient's shortness of breath was unclear, but it was possible that it could be due to diastolic congestive heart failure exacerbation in view of elevated BNP and initial chest x-ray findings. The patient received one dose of Lasix in the emergency department and subsequently was completely asymptomatic. Her lungs were clear with no evidence of crepitation or wheezing. The patient was not given additional doses of Lasix since she was asymptomatic and during her previous admission had worsening of renal function related to diuretic use. A repeat chest x-ray was done which showed improvement in the appearance of her chest, although the chest x-ray was read as atelectasis versus infiltrate. There was low clinical suspicion for pneumonia in view of symptomatic improvement and afebrile status. Cardiac enzymes were also checked which were normal. The patient had leukocytosis with white count of 6.4 and that was found to be due to reactive and not related to infection. The patient has chronic kidney disease and her creatinine has remained stable during the hospitalization. Her diabetes and hypertension also were stable. She had no evidence of chronic obstructive pulmonary disease exacerbation. The rest of her medical problems remained stable during the hospitalization and she is being discharged home in a stable condition.   TIME SPENT: 45 minutes.  ____________________________ Cherre Huger, MD sp:slb D: 03/26/2011 15:51:31 ET T: 03/27/2011 11:36:46 ET JOB#: [id number]  cc: Cherre Huger, MD, <  Dictator> Jaquelyn Bitter B. Brynda Greathouse, MD Cherre Huger MD ELECTRONICALLY SIGNED 03/27/2011 19:25

## 2014-05-20 NOTE — Consult Note (Signed)
Chief Complaint:   Subjective/Chief Complaint Pt still sob now no cp. No f/c/s.She still has leg edema.   VITAL SIGNS/ANCILLARY NOTES: **Vital Signs.:   01-Jan-13 14:18   Vital Signs Type Routine   Temperature Temperature (F) 98.2   Celsius 36.7   Temperature Source axillary   Pulse Pulse 72   Pulse source per Dinamap   Respirations Respirations 20   Systolic BP Systolic BP 782   Diastolic BP (mmHg) Diastolic BP (mmHg) 66   Mean BP 98   BP Source Dinamap   Pulse Ox % Pulse Ox % 95   Pulse Ox Activity Level  At rest   Oxygen Delivery 2L  *Intake and Output.:   Daily 01-Jan-13 07:00   Grand Totals Intake:  1419 Output:  1075    Net:  344 24 Hr.:  344   Oral Intake      In:  960   IV (Primary)      In:  459   Urine ml     Out:  1075   Length of Stay Totals Intake:  2324 Output:  2175    Net:  149   Brief Assessment:   Cardiac Regular  murmur present  + LE edema  + JVD    Respiratory normal resp effort  rhonchi  crackles    Gastrointestinal Normal    Gastrointestinal details normal Soft  Nontender  Nondistended  No masses palpable  No rebound tenderness   Routine Coag:  01-Jan-13 13:11    Activated PTT (APTT) 64.8   Radiology Results: XRay:    30-Dec-12 05:41, Chest Portable Single View   Chest Portable Single View    REASON FOR EXAM:    sob  COMMENTS:       PROCEDURE: DXR - DXR PORTABLE CHEST SINGLE VIEW  - Jan 25 2011  5:41AM     RESULT: Comparison: 10/20/2007    Findings:     Single portable AP chest radiograph is provided. There is mild bilateral   interstitialthickening. There is no focal parenchymal opacity, pleural   effusion, or pneumothorax. Normal cardiomediastinal silhouette. There is   evidence of prior CABG. The osseous structures are unremarkable.    IMPRESSION:   No acute disease of the chest.          Verified By: Jennette Banker, M.D., MD    01-Jan-13 12:52, Chest PA and Lateral   Chest PA and Lateral    REASON FOR EXAM:    f/u  pul edema  COMMENTS:       PROCEDURE: DXR - DXR CHEST PA (OR AP) AND LATERAL  - Jan 27 2011 12:52PM     RESULT: Comparison: 01/25/2011    Findings:  The lung volumes are low. Cardiomegaly and the mediastinum are similar to   prior. There are mild interstitial pulmonary opacities. These are solid   more conspicuous than prior. Linear opacity in the left lateral lung   likely secondary to atelectasis or scarring. The lung volumes are low.   Evaluation of the lateral view is limited by respiratory motion. The   posterior thorax is excluded from the field-of-view on the repeat lateral   image.  IMPRESSION:   Mild interstitial pulmonary opacities may be secondary to atelectasis   from the low lung volumes. Interstitial pulmonary edema is a differential   consideration.          Verified By: Gregor Hams, M.D., MD  Lab:    30-Dec-12 05:30, ABG  pH (ABG) 7.34   PCO2 45   PO2 419   FiO2 100   Base Excess -1.7   HCO3 24.3   O2 Saturation 100.0   O2 Device BIPAP   Specimen Site (ABG)    RT RADIAL   Specimen Type (ABG) ARTERIAL   Patient Temp (ABG) 37.0   PSV 12   PEEP 6.0   Mechanical Rate 10    30-Dec-12 06:20, ABG and Lactic Acid   pH (ABG) 7.36   PCO2 44   PO2 82   FiO2 30   Base Excess -0.8   HCO3 24.9   O2 Saturation 95.5   O2 Device V-60   Specimen Site (ABG)    RT RADIAL   Specimen Type (ABG) ARTERIAL   Patient Temp (ABG) 37.0   PSV 12   PEEP 6.0   Mechanical Rate 10   Result(s) reported on 25 Jan 2011 at 06:18AM.   Lactic Acid, Cardiopulmonary 1.3   Result(s) reported on 25 Jan 2011 at 06:18AM.    30-Dec-12 10:45, ABG   pH (ABG) 7.38   PCO2 44   PO2 95   FiO2 30   Base Excess 0.5   HCO3 26.0   O2 Saturation 97.2   O2 Device BIPAP   Specimen Site (ABG)    RT RADIAL   Specimen Type (ABG) ARTERIAL   Patient Temp (ABG) 37.0   PSV 12   PEEP 6.0   Mechanical Rate 10   Result(s) reported on 25 Jan 2011 at 11:07AM.  Cardiology:    30-Dec-12  05:33, ED ECG   Ventricular Rate 79   Atrial Rate 79   P-R Interval 152   QRS Duration 106   QT 394   QTc 451   P Axis 53   R Axis -50   T Axis 137   ECG interpretation    *** Poor data quality, interpretation may be adversely affected  Normal sinus rhythm  Left axis deviation  Incomplete right bundle branch block  Left ventricular hypertrophy with repolarization abnormality  Cannot rule out Septal infarct , age undetermined  Abnormal ECG  When compared with ECG of 20-Oct-2007 11:29,  Minimal criteria for Septal infarct are now Present  ST more depressed Lateral leads  ----------unconfirmed----------  Confirmed by OVERREAD, NOT ([id number]), editor PEARSON, BARBARA ([id number]) on 01/26/2011 10:58:32 AM   ED ECG     31-Dec-12 16:56, Echo Doppler   Echo Doppler    Interpretation Summary    The left ventricle is not well visualized. The left ventricle is   grossly normal size. There is no thrombus. Left ventricular systolic   function is normal. Ejection Fraction = >55%. There is mild   concentric left ventricular hypertrophy. The left ventricular wall   motion is normal. The right ventricular systolic function is normal.    Procedure:    A two-dimensional transthoracic echocardiogram with color flow and   Doppler was performed.    The study was completed bedside.    Left Ventricle    There is mild concentric left ventricular hypertrophy.    Left ventricular systolic function is normal.    Ejection Fraction = >55%.    The left ventricle is not well visualized.    The left ventricle is grossly normal size.    There is no thrombus.    The left ventricular wall motion is normal.    Right Ventricle    The right ventricle is not well visualized.    Borderline right  ventricular enlargement.    There is normal right ventricular wall thickness.    The right ventricular systolic function is normal.    Atria    The left atrium is mildly dilated.    Borderline right atrial  enlargement.    Mitral Valve    The mitral valve leaflets appear thickened, but open well.    There is mild mitral regurgitation.    Tricuspid Valve    There is mild tricuspid regurgitation.    The tricuspid valve is not well visualized.    Aortic Valve    The aortic valve is normal in structure and function.    No aortic regurgitation is present.    Pulmonic Valve    The pulmonicvalve is not well seen, but is grossly normal.    There is no pulmonic valvular regurgitation.    Great Vessels    The aortic root is not well visualized but is probably normal size.    Pericardium/Pleural    No pericardial effusion.    There is no pleural effusion.    MMode 2D Measurements and Calculations    IVSd: 1.3 cm    LVIDd: 4.1 cm    LVIDs: 2.8 cm    LVPWd: 1.7 cm    FS: 31 %    EF(Teich): 59 %    LA dimension: 4.3 cm    LVOT diam: 2.0 cm    Doppler Measurements and Calculations    MV E point: 92 cm/sec    MV A point: 66 cm/sec    MV E/A: 1.4     MV dec time: 0.24 sec    Ao V2 max: 155 cm/sec    Ao max PG: 10 mmHg    AVA(V,D): 2.0 cm2    LV max PG: 4.0 mmHg    LV V1 max: 101 cm/sec    TR Max vel: 197 cm/sec    TR Max PG: 16 mmHg    RVSP: 21 mmHg    RAP systole: 5.0 mmHg    Reading Physician: Lujean Amel   Sonographer: Sherrie Sport  Interpreting Physician:  Lujean Amel,  electronically signed on   01-27-2011 13:53:10  Requesting Physician: Lujean Amel   Assessment/Plan:  Invasive Device Daily Assessment of Necessity:   Does the patient currently have any of the following indwelling devices? none   Assessment/Plan:   Assessment IMP CHF SOB Edema HTN DM CVA CAD COPD OSA AFIB CRI    Plan PLAN Tele Lasix IV Bp control 02 DM control ROMI Agree with ECHO Support stockings Anticoug CPAP for OSA Renal input for CRI Increase activity   Electronic Signatures: Lujean Amel D (MD)  (Signed 02-Jan-13 22:35)  Authored: Chief Complaint,  VITAL SIGNS/ANCILLARY NOTES, Brief Assessment, Lab Results, Radiology Results, Assessment/Plan   Last Updated: 02-Jan-13 22:35 by Lujean Amel D (MD)

## 2014-05-20 NOTE — Consult Note (Signed)
PATIENT NAME:  Susan Graham, MCCARDLE MR#:  [id number] DATE OF BIRTH:  [birth year]  DATE OF CONSULTATION:  01/29/2011  REFERRING PHYSICIAN:   CONSULTING PHYSICIAN:  Gonzella Lex, MD  IDENTIFYING INFORMATION AND REASON FOR CONSULT: 69 year old woman in the hospital with respiratory failure, possible congestive heart failure, and out of control diabetes as well as multiple other medical problems. Consult is for "severe depression with psychosis".  HISTORY OF PRESENT ILLNESS: The patient has been in the hospital for a few days now suffering with respiratory difficulty, congestive heart failure, and out of control diabetes among other medical problems. Evidently she must have mentioned the depression to someone triggering the consult today. I got information from the patient, from the patient's boyfriend, from the patient's daughter, and from the chart. The patient is an inconsistent historian. The patient reported initially to me that she was feeling okay but then said that she was feeling up and down and other points said that she was feeling depressed. She indicated that she felt tired. She did indicate that she felt she was still able to enjoy visits from family and friends who came in to see her in the hospital. She did tell me that she had had some suicidal thoughts recently and that even before coming into the hospital she had thought about taking an overdose. She indicates that she does not feel like she is actively planning to hurt herself in the hospital now. She told me that earlier today she had had what she is characterizing as a hallucination when she had seen her sister sitting in a chair at one point when her sister wasn't there and at another point had mistaken the doctor for a member of her family. She does not report any other acute psychotic symptoms. The boyfriend tells me that he does not think that the patient has been more depressed than usual. The patient's mother died a few months ago and  she has been tearful and down ever since that happened but he thinks that her emotional state has been fairly stable recently. When I spoke to the daughter who seems to have a very good long-term feel for her mother's mental health, she told me that she did not think that her mother was having acute depression. She thinks that her mother is currently doing actually quite well emotionally. She indicates that inconsistent answers to questions are typical for her mother and that her past psychiatric history has been so severe that she is very hesitant to see any changes to her medication. The daughter indicates that after the patient had a stroke during a bypass operation she has been much more emotionally unpredictable and has had very touchy responses to different medications. The daughter thinks that the current dose of Effexor at 75 mg a day has been by far the most helpful medicine the patient has ever been on.   PAST PSYCHIATRIC HISTORY: Most of the details I got came from the patient's daughter. She reports that the patient had chronic mental health problems with labile behavior for as long as she can remember. The patient has evidently had several hospitalizations in the past and has had suicide attempts in the past but it's been several years since any of that has happened. It seems like probably since her medical problems have been much more severe her behavior problems have been less prominent. The daughter reports that over the years there were a great many medications the patient was tried on. She thinks the  Effexor has been particularly helpful for her. She also thinks the small dose of Navane that she takes at night has been helpful for her.   PAST MEDICAL HISTORY: The patient has multiple acute and chronic severe medical problems. Currently she has acute respiratory failure possibly related to an infection or to congestive heart failure. Her diabetes has been difficult to control. Her blood pressure  has been out of control. 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   SOCIAL HISTORY: The patient had apparently recently been in a rehab facility prior to coming back to the hospital. She is not currently married but her boyfriend has been with her for a few years and seems to be fairly devoted to helping her. The patient apparently went through a very difficult separation from her husband several years ago which was emotionally distressing to her. She says she has two adult children. The son is in and out of town but her daughter lives locally and has been actively involved in her care for a long time.   FAMILY HISTORY: There is an extensive family history of depression.   REVIEW OF SYSTEMS: The patient complains of fatigue. She is inconsistent in her complaints of mood symptoms. She said she had had visual hallucinations recently but is not actively having any. She did not appear to be delusional, paranoid, or bizarre in her speech right now. She is still feeling depressed at times.   MENTAL STATUS EXAM: The patient was interviewed in her hospital room. She did have her bed sat up so that she could make eye contact with me. Initially her psychomotor activity was improved and she made good eye contact but she became drowsy and slower throughout the interview and eventually was unable to answer any further questions. Her mood is stated initially as being okay although at another point she said she was feeling more depressed. She said she had had some suicidal thoughts recently but denied any intention in the hospital. She says she is still able to enjoy most things like people visiting her when they can happen. Her affect was flat and constricted. Thoughts were slow and simple. No obvious paranoia or bizarre thinking. Had had some recent visual allusions or hallucinations but did not appear to be hallucinating when I  interviewed her. Judgment and insight appear to be probably chronically impaired.   CURRENT PSYCHIATRIC MEDICATIONS: She is on a great many medications but currently her psychiatric medicines are Effexor extended-release 75 mg once a day and Navane 2 mg at night which is the same as what she was taking when she came into the hospital.   ASSESSMENT: This is a 69 year old woman with a long history of depression as well as multiple other medical problems who is currently extremely sick. She is an inconsistent historian at best. Fortunately, her daughter and boyfriend both appear to be very knowledgeable about her and seem to have good insight. Initially after interviewing her I would have considered making some changes to her psychiatric medicine, however, the daughter implored me to not make any changes. The daughter feels very strongly that her current medication is the best she has ever been on and is very afraid about the results of changing her medicine. Right now I do not think that the patient is acutely dangerous to herself in the hospital. She has been cooperative with treatment. I will defer making any medication  treatment changes for now.   TREATMENT PLAN: As noted above, no change to medication. Continue to monitor in the hospital. I have told the daughter that she should get in touch with the treatment team if she is concerned that her mother is doing worse.   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   AXIS II: Deferred.   AXIS III:  1. Respiratory failure. 2. Diabetes. 3. Hypertension. 4. Coronary artery disease. 5. Status post stroke. 6. Multiple medical problems.   AXIS IV: Severe. Stress from medical illness and post stroke condition.     AXIS V: Functioning at time of evaluation 30.   ____________________________ Gonzella Lex,  MD jtc:drc D: 01/29/2011 17:17:13 ET T: 01/29/2011 17:53:27 ET JOB#: [id number]  cc: Gonzella Lex, MD, <Dictator> Gonzella Lex MD ELECTRONICALLY SIGNED 01/30/2011 13:27

## 2014-05-20 NOTE — H&P (Signed)
Past Med/Surgical Hx:  CVA/Stroke:   cva:   Diverticulosis:   MI - Myocardial Infarct:   GERD:   Diabetes:   anxiety:   Bipolar Disorder:   Hypothyroidism:   left knee surgery:   Hysterectomy - Total:   CABG:   ALLERGIES:  Levaquin: Itching  Sulfa: Unknown  Risperdal: Unknown  Codeine: Unknown    Assessment/Admission Diagnosis pt is 69y.o with multiple hospitaliazations here for sob, chf, who was seen here in last week had neg ct per pe d/c home returns with worsening sob  1. sob likely multifactoriall, likely  diastolic chf, copd, OSA, deconditioning - cont with iv lasix (got one dose in the ED) nebs, will hold steriods for now, will treat as HCAP with vanc and zosyn.  Evaluate need for IV lasix on a daily basis, given elevated Cr.  - PT consult - if no improvement in 1-2 days, will consider repeat chest CT and pulmonary consult - follow up blood and urine cultures, obtain sputum culture  2. chronic diastolic chf  - iv lasix prn  3. ckd stage 3- pt has had cr that has fluctuated from 2.3 to normal in past, follow renal fxn if worsens nephrology eval  4. leukocytosis -  possible pna, iv abx  5. dm 2 -  continue lantus and ssi  6. sleep apnea- cpap qhs  7.  hypothyrodism- continue levothyroxine   8.  elevated trop - h/o cad, likely due to crf/cad, currently trending down from last admission  FULL CODE PMD - Dr. Brynda Greathouse  Time spent evaluating patient = 50 minutes Job# [id number]   Electronic Signatures: Vilinda Boehringer (MD)  (Signed 07-Jul-13 21:32)  Authored: PAST MEDICAL/SURGIAL HISTORY, ALLERGIES, HOME MEDICATIONS, ASSESSMENT AND PLAN   Last Updated: 07-Jul-13 21:32 by Vilinda Boehringer (MD)

## 2014-05-20 NOTE — Consult Note (Signed)
PATIENT NAME:  Susan Graham, Susan Graham MR#:  [id number] DATE OF BIRTH:  28-Sep-1945  DATE OF CONSULTATION:  01/26/2011  REFERRING PHYSICIAN:  Nicky Pugh, MD/[facility]  CONSULTING PHYSICIAN:  Dwayne D. Callwood, MD  INDICATION: Congestive heart failure, possible non-Q-wave myocardial infarction with shortness of breath.   HISTORY OF PRESENT ILLNESS: Ms. Austad is a 69 year old white female with past history of coronary disease, diabetes, hypertension, cerebrovascular accident, obesity, obstructive sleep apnea, hypothyroidism, reflux, and leg edema who presented with known bipolar disorder, anxiety, and had worsening shortness of breath over the last few days. She has had some PND and orthopnea with leg swelling. She has had no significant improvement in her symptoms of shortness of breath and with the progression of the symptoms. She had some mild intermittent chest pain over the last few days in her right substernal area and it wasn't radiating. Pain was 8 out of 10. Over the last couple of days it got progressively worse and she finally came to the Emergency Room for evaluation. Temperature was 99. She states she is compliant with her medication but does not use her CPAP medication. She was evaluated in the ER and placed on BiPAP. She had elevated troponin and elevated creatinine of renal insufficiency and EKG was abnormal so she was advised to be admitted for evaluation.   REVIEW OF SYSTEMS: No blackout spells or syncope. Denies nausea or vomiting. She has had low-grade fever. No chills. No sweats. No weight gain. No hemoptysis or hematemesis. No bright red blood rectum. No vision change or hearing change. Denies sputum production or cough.   PAST MEDICAL HISTORY:  1. Hypertension. 2. Diabetes. 3. Obesity. 4. Pseudotumor cerebri. 5. Bipolar disorder.  6. Anxiety.  7. Coronary disease.  8. CVA. 9. Hypothyroidism.  10. Reflux. 30. Former smoker.  12. Malignant hypertension. 13. Obstructive sleep  apnea. 14. Leg edema.   PAST SURGICAL HISTORY:  1. Coronary artery bypass surgery.  2. Hysterectomy.  3. Knee surgery.   ALLERGIES: Codeine, Levaquin, Risperdal, sulfa.  MEDICATIONS: 1. Aspirin 325 a day.  2. Plavix 75 mg a day.  3. Norvasc 5 mg a day.  4. Toprol-XL 100 a day.  5. Percocet 5/325 twice a day p.r.n.  6. Omeprazole 20 a day.  7. Lantus 50 units at bedtime.  8. Humalog mix 75/25/50 units q.a.m.  9. Pravastatin 20 a day.  10. Thiothixene 2 mg at bedtime. 11. Losartan/HCTZ 100/25 1 tablet daily. 12. Benztropine 1 mg daily. 13. Taxol 5 mg at bedtime.  14. Synthroid 50 mcg a day.  15. Pramipexole 5 mg at bedtime for restless legs p.r.n.  16. Venlafaxine XR 75 mg a day.   FAMILY HISTORY: Cardiac disease, CHF, myocardial infarction, coronary artery bypass surgery.   SOCIAL HISTORY: Former tobacco smoker. No alcohol consumption. Married. Lives at home. 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 She is divorced. Has two children.   PHYSICAL EXAMINATION:   VITAL SIGNS: Blood pressure 200/80, pulse 70, respiratory rate 18, afebrile.   HEENT: Normocephalic, atraumatic. Pupils equal and reactive to light.   NECK: Supple. Mild JVD bilaterally.   LUNGS: Bilateral rhonchi. Faint rales in the bases. Positive rhonchi throughout. Adequate air movement.   HEART: Regular rate and rhythm. Systolic ejection murmur at the apex. PMI nondisplaced. Positive S4.   ABDOMEN: Benign. Positive bowel sounds. No rebound, guarding, or tenderness.   EXTREMITIES: 2+ edema. Adequate movement. No cyanosis or clubbing.   NEUROLOGIC: Grossly intact.   SKIN: Normal.   LABORATORY, DIAGNOSTIC, AND RADIOLOGICAL  DATA: BUN 30, creatinine 1.6, glucose 371, sodium 134. Troponin was elevated at 0.16. TSH 6. BNP 1600. Chest x-ray bilateral interstitial thickening, mild failure.  EKG sinus rhythm, right bundle branch block, rate of 80, LVH, nonspecific findings.   ASSESSMENT:  1. Congestive heart  failure. 2. Shortness of breath. 3. Edema. 4. Malignant hypertension.  5. Diabetes.  6. Coronary disease. 7. History of cerebrovascular accident.  8. Renal insufficiency.  9. Hypothyroidism.  10. Hyponatremia.  11. Restless leg syndrome.  12. Hyperlipidemia.   PLAN:  1. Agree with admit. 2. Rule out for myocardial infarction.  3. Follow troponins with her elevated troponin. 4. Continue anticoagulation.  5. Follow-up EKGs.  6. Consider echocardiogram.  7. Continue blood pressure control for malignant hypertension. I suspect she is having diastolic dysfunction.  8. Will try to diurese accordingly. 9. Follow-up renal insufficiency because of her worsening renal function.  10. Diabetes management with sliding scale while she is in the hospital.  11. Continue lipid management.  12. Recommend weight loss.  13. Consider CPAP or BiPAP for her pulmonary status. 14. Continue medication for restless leg syndrome.  68. Consider Nephrology consult for renal insufficiency.  16. Reflux medication should be continued.  17. Continue medical evaluation on the results of her noninvasive studies and if she improves heart failure wise then would hopefully be able to treat the patient as outpatient. Do not recommend cardiac cath at this point.   ____________________________ Loran Senters. Clayborn Bigness, MD ddc:drc D: 01/27/2011 17:34:00 ET T: 01/28/2011 11:17:51 ET JOB#: [id number]  cc: Dwayne D. Clayborn Bigness, MD, <Dictator> Yolonda Kida MD ELECTRONICALLY SIGNED 02/12/2011 14:17

## 2014-05-20 NOTE — Consult Note (Signed)
Details:    - Psychiatry: Patient is seen today for followup. She reports that she is still feeling down. she had some difficulty sleeping last night. She continues to have some hopeless thoughts.. On the other hand,she states that she really wants to get better from her current illness.She admits that shey wants to live. She also admits that she enjoys visits from her family. She was able to laugh and show some normal affect with her son.she does not report any acute intent to harm h Herself. At this point,I do not recommend any change to her antidepressant medication. The patient agrees to this .I will continue to follow up with her if she is still in the hospital after the weekend.   Electronic Signatures: Gonzella Lex (MD)  (Signed 04-Jan-13 14:50)  Authored: Details   Last Updated: 04-Jan-13 14:50 by Gonzella Lex (MD)

## 2014-05-20 NOTE — H&P (Signed)
PATIENT NAME:  Susan Graham, HERBST MR#:  [id number] DATE OF BIRTH:  May 07, 1945  DATE OF ADMISSION:  03/25/2011  PRIMARY CARE PHYSICIAN: Nicky Pugh, MD  PRIMARY CARDIOLOGIST: Lujean Amel, MD  CHIEF COMPLAINT: Increased shortness of breath.   HISTORY OF PRESENT ILLNESS: Ms. Susan Graham is a 69 year old Caucasian female with past medical history of severe systemic hypertension and history of diastolic congestive heart failure with normal ejection fraction at 55%. The patient is also suspected to have underlying chronic obstructive pulmonary disease. Last admission was in January of this year with similar presentation. The patient came to the emergency department seeking treatment for development of shortness of breath for the last one day. She denies having any chest pain, no cough, no sputum production, no fever, and no chills. Evaluation here reveals evidence of mild congestive heart failure and fluid in the chest, and there was a question about whether she has left-sided atelectasis versus pneumonia. Her white blood cell count is elevated at 16,000. The patient is fragile and during the last admission, when she was diuresed, her kidney failure worsened. The patient was admitted for further evaluation and management.   REVIEW OF SYSTEMS: CONSTITUTIONAL: Denies any fever. No chills. No night sweats. No fatigue. EYES: No blurring of vision. No double vision. ENT: No hearing impairment. No sore throat. No dysphagia. CARDIOVASCULAR: No chest pain but admits having shortness of breath. She has no syncope. She has mild peripheral edema, only a trace. RESPIRATORY: Admits having shortness of breath, but no chest pain. No cough. No sputum production. GASTROINTESTINAL: No abdominal pain. No nausea, no vomiting, and no diarrhea. GENITOURINARY: No dysuria or frequency of urination. MUSCULOSKELETAL: No joint swelling or pain. No muscular pain or swelling. INTEGUMENTARY: No skin rash. No ulcers. NEUROLOGIC: No focal  weakness. No seizure activity. No headache. PSYCHIATRY: No anxiety. No depression right now. ENDOCRINE: Denies any night sweats. No heat or cold intolerance. No polyuria or nocturia.   PAST MEDICAL HISTORY:  1. History of coronary artery disease status post coronary artery bypass graft in 2008 at [facility]  2. History of diastolic congestive heart failure with normal ejection fraction at 55%, done in January of this year.  3. History of suspected chronic obstructive pulmonary disease.  4. History of severe systemic hypertension.  5. Diabetes mellitus type 2, on insulin. 6. Obesity. 7. History of pseudo-tumor cerebri. 8. History of bipolar disorder/anxiety disorder.  9. History of stroke during her coronary artery bypass graft resulting in mild right hemiparesis.  10. History of hypothyroidism.  11. History of gastroesophageal reflux disease. 12. Ex-chronic smoker. 13. History of restless leg syndrome.   PAST SURGICAL HISTORY:  1. Coronary artery bypass graft either in 2007 or 2008. 2. History of hysterectomy.  3. History of left knee surgery.   FAMILY HISTORY: Her mother suffered from congestive heart failure, so has her father. She has two brothers with coronary artery bypass graft.   SOCIAL HISTORY: She is an ex-chronic smoker. She quit in 2008. No history of alcohol or other drug abuse. The patient is divorced. She lives at home and is followed up by a home health nurse; recently discharged from pulmonary rehabilitation.   ADMISSION MEDICATIONS:  1. Advair Diskus 250/50 one puff twice a day. 2. Amlodipine 10 mg a day. 3. Aspirin 325 mg a day. 4. Plavix 75 mg a day. 5. Benztropine 1 mg at bedtime. 6. Vasotec 2.5 mg once a day. 7. Hydralazine 25 mg three times daily. 8. Isosorbide mononitrate 30  mg once a day.  9. Lantus 65 units at bedtime.  10. NovoLog mix 70/30, 50 units in the morning.  11. Levothyroxine 75 mcg once a day. 12. Metoprolol  extended-release 100 mg once a day. 13. Pravastatin 20 mg once a day. 14. Spiriva once a day. 15. Thiothixene 2 mg at bedtime. 16. Venlafaxine (Effexor) 75 mg once a day.  17. Percocet 5/325 mg one tablet every 4 to 6 hours p.r.n. for pain. 18. Pramipexole 0.5 mg once a day in the morning.   ALLERGIES: Sulfa, Levaquin, codeine, and Risperdal.   PHYSICAL EXAMINATION:   VITAL SIGNS: Blood pressure 141/62, respiratory rate 18, pulse 56, temperature 98, and oxygen saturation 98%. She is on oxygen now and she feels much better.   GENERAL APPEARANCE: She is an elderly lady lying in bed in no acute distress.   HEAD AND NECK: Unremarkable. No pallor. No icterus. No cyanosis.  EARS, NOSE, AND THROAT: Hearing was normal. Nasal mucosa, lips, and tongue were normal.   EYES: Normal eyelids and conjunctiva. Pupils are about 4 mm, equal, and sluggishly reactive to light.   NECK: Supple. Trachea at midline. No masses. No lymphadenopathy.   HEART: Normal S1, S2. No S3, S4. No murmur. No gallop. No rub. No carotid bruits.   RESPIRATORY: Normal breathing pattern without use of accessory muscles. No rales. No wheezing.   ABDOMEN: Soft without tenderness. No hepatosplenomegaly. No masses. No hernias.   SKIN: No ulcers. No subcutaneous nodules.   MUSCULOSKELETAL: No joint swelling. No clubbing.   NEUROLOGIC: Cranial nerves II through XII are intact. No focal motor deficit.   PSYCHIATRIC: The patient is alert and oriented to people and place. Mood and affect were flat.   LABS/STUDIES: EKG showed sinus bradycardia at rate of 56 per minute. Left axis deviation. Nonspecific T wave abnormalities in the lateral leads, otherwise unremarkable EKG.  Chest x-ray showed finding suggestive of low grade congestive heart failure. There is question about increased density on the left, either secondary to atelectasis or developing infiltrate.  Her B-type natriuretic peptide was elevated at 960. Glucose was 133,  BUN 41, and creatinine 1.7. Her last creatinine in January of this year was 1.23. Her sodium is 139 and potassium 4.3. Her GFR is estimated at 30. Normal liver transaminases. Troponin 0.02. Normal CPK. CBC showed elevated white count of 16,000, hemoglobin 12, hematocrit 36, and platelet count 319.   ASSESSMENT:  1. Shortness of breath for 24 hours. The presentation is fitting volume overload and congestive heart failure, most likely secondary to diastolic dysfunction. Her recent echocardiogram in January of this year showed normal ejection fraction at 55%.  2. Question of left-sided atelectasis versus infiltrate raising suspicion for pneumonia.  3. Leukocytosis; white blood cell count is 16,000 which further raises suspicion for pneumonia.  4. Elevated creatinine; this is above her baseline with GFR of 30. Likely some degree of acute renal failure on chronic renal failure, stage III.  5. History of chronic disease status post coronary artery bypass graft in 2008. 6. Diabetes mellitus, type 2.  7. Chronic obstructive pulmonary disease.  8. History of stroke in 2008 during her coronary artery bypass graft. 9. Systemic hypertension. 10. Hypothyroidism. 11. Gastroesophageal reflux disease.  12. History of anxiety and history of bipolar disorder. 13. Past history of pseudo tumor cerebri.   PLAN: Admit the patient to telemetry. Follow-up on cardiac enzymes. Gentle diuresis. She received one dose of Lasix in the emergency department. Repeat basic metabolic profile in the  morning to ensure there is no worsening in the creatinine. I ordered a chest x-ray, PA and lateral, to be done in the morning since the chest x-ray in the emergency department was portable. We need further clarification on the left lower lung zone whether there are infiltrates there or not. Empirically I started the patient on Rocephin 1 gram daily to cover for that. Accu-Chek and insulin sliding scale. Continue home medications, as listed  above. For deep vein thrombosis prophylaxis, I placed the patient on Lovenox at 30 mg subcutaneous daily. For peptic ulcer disease prophylaxis, Protonix 40 mg once a day.   TIME NEEDED TO EVALUATE THIS PATIENT: More than 55 minutes.  ____________________________ Clovis Pu. Lenore Manner, MD amd:slb D: 03/25/2011 01:27:21 ET T: 03/25/2011 07:21:40 ET JOB#: [id number]  cc: Clovis Pu. Lenore Manner, MD, <Dictator> Mikeal Hawthorne. Brynda Greathouse, MD Clovis Pu [place] MD ELECTRONICALLY SIGNED 04/02/2011 0:21

## 2014-05-20 NOTE — Consult Note (Signed)
Chief Complaint:   Subjective/Chief Complaint Doing somewhat better but still sob and alittle confused.   VITAL SIGNS/ANCILLARY NOTES: **Vital Signs.:   04-Jan-13 10:00   Temperature Temperature (F) 98.1   Celsius 36.7   Pulse Pulse 64   Respirations Respirations 20   Systolic BP Systolic BP 466   Diastolic BP (mmHg) Diastolic BP (mmHg) 53   Mean BP 84   Pulse Ox % Pulse Ox % 96   Oxygen Delivery 2L  *Intake and Output.:   Daily 04-Jan-13 07:00   Grand Totals Intake:  800 Output:  1350    Net:  -550 24 Hr.:  -550   Oral Intake      In:  600   IV (Secondary)      In:  200   Urine ml     Out:  1350   Length of Stay Totals Intake:  5104 Output:  5993    Net:  -671   Brief Assessment:   Cardiac Regular  murmur present  + LE edema  + JVD    Respiratory normal resp effort  rhonchi  crackles    Gastrointestinal Normal    Gastrointestinal details normal Soft  Nontender  Nondistended  No masses palpable  No rebound tenderness   Routine Hem:  04-Jan-13 05:53    WBC (CBC) 14.9   RBC (CBC) 4.50   Hemoglobin (CBC) 12.9   Hematocrit (CBC) 38.3   Platelet Count (CBC) 303   MCV 85   MCH 28.7   MCHC 33.7   RDW 13.6   Neutrophil % 87.0   Lymphocyte % 5.8   Monocyte % 7.2   Eosinophil % 0.0   Basophil % 0.0   Neutrophil # 12.9   Lymphocyte # 0.9   Monocyte # 1.1   Eosinophil # 0.0   Basophil # 0.0  Routine Chem:  04-Jan-13 05:53    Glucose, Serum 290   BUN 93   Creatinine (comp) 1.75   Sodium, Serum 130   Potassium, Serum 4.7   Chloride, Serum 93   CO2, Serum 26   Calcium (Total), Serum 9.0   Osmolality (calc) 300   eGFR (African American) 38   eGFR (Non-African American) 31   Anion Gap 11   Radiology Results: XRay:    30-Dec-12 05:41, Chest Portable Single View   Chest Portable Single View    REASON FOR EXAM:    sob  COMMENTS:       PROCEDURE: DXR - DXR PORTABLE CHEST SINGLE VIEW  - Jan 25 2011  5:41AM     RESULT: Comparison: 10/20/2007    Findings:      Single portable AP chest radiograph is provided. There is mild bilateral   interstitialthickening. There is no focal parenchymal opacity, pleural   effusion, or pneumothorax. Normal cardiomediastinal silhouette. There is   evidence of prior CABG. The osseous structures are unremarkable.    IMPRESSION:   No acute disease of the chest.          Verified By: Jennette Banker, M.D., MD    01-Jan-13 12:52, Chest PA and Lateral   Chest PA and Lateral    REASON FOR EXAM:    f/u pul edema  COMMENTS:       PROCEDURE: DXR - DXR CHEST PA (OR AP) AND LATERAL  - Jan 27 2011 12:52PM     RESULT: Comparison: 01/25/2011    Findings:  The lung volumes are low. Cardiomegaly and the mediastinum are similar to  prior. There are mild interstitial pulmonary opacities. These are solid   more conspicuous than prior. Linear opacity in the left lateral lung   likely secondary to atelectasis or scarring. The lung volumes are low.   Evaluation of the lateral view is limited by respiratory motion. The   posterior thorax is excluded from the field-of-view on the repeat lateral   image.  IMPRESSION:   Mild interstitial pulmonary opacities may be secondary to atelectasis   from the low lung volumes. Interstitial pulmonary edema is a differential   consideration.          Verified By: Gregor Hams, M.D., MD  [place]:    04-Jan-13 14:48, US Kidney Bilateral   US Kidney Bilateral    REASON FOR EXAM:    ARF, CKD  COMMENTS:       PROCEDURE: [place]  - US KIDNEY  - Jan 30 2011  2:48PM     RESULT: The right kidney is small and hyperechogenic consistent with an   atrophic right kidney. The right kidney measures 8.38 cm x 4.11 cm x 4.19   cm and the left kidney measures 12.56 cm x 4.64 cm x 4.67 cm. No solid or   cystic renal mass lesions are seen. No renal calcifications are   identified. There is no hydronephrosis. The urinary bladder is empty and   is not visualized adequately for evaluation. A Foley  catheter is present.    IMPRESSION:   1. No hydronephrosis or other acute change is identified.  2. Atrophic right kidney.  Thank you for the opportunity to contribute to the care of your patient.           Verified By: Dionne Ano WALL, M.D., MD  Lab:    30-Dec-12 05:30, ABG   pH (ABG) 7.34   PCO2 45   PO2 419   FiO2 100   Base Excess -1.7   HCO3 24.3   O2 Saturation 100.0   O2 Device BIPAP   Specimen Site (ABG)    RT RADIAL   Specimen Type (ABG) ARTERIAL   Patient Temp (ABG) 37.0   PSV 12   PEEP 6.0   Mechanical Rate 10    30-Dec-12 06:20, ABG and Lactic Acid   pH (ABG) 7.36   PCO2 44   PO2 82   FiO2 30   Base Excess -0.8   HCO3 24.9   O2 Saturation 95.5   O2 Device V-60   Specimen Site (ABG)    RT RADIAL   Specimen Type (ABG) ARTERIAL   Patient Temp (ABG) 37.0   PSV 12   PEEP 6.0   Mechanical Rate 10   Result(s) reported on 25 Jan 2011 at 06:18AM.   Lactic Acid, Cardiopulmonary 1.3   Result(s) reported on 25 Jan 2011 at 06:18AM.    30-Dec-12 10:45, ABG   pH (ABG) 7.38   PCO2 44   PO2 95   FiO2 30   Base Excess 0.5   HCO3 26.0   O2 Saturation 97.2   O2 Device BIPAP   Specimen Site (ABG)    RT RADIAL   Specimen Type (ABG) ARTERIAL   Patient Temp (ABG) 37.0   PSV 12   PEEP 6.0   Mechanical Rate 10   Result(s) reported on 25 Jan 2011 at 11:07AM.  Cardiology:    30-Dec-12 05:33, ED ECG   Ventricular Rate 79   Atrial Rate 79   P-R Interval 152   QRS Duration 106  QT 394   QTc 451   P Axis 53   R Axis -50   T Axis 137   ECG interpretation    *** Poor data quality, interpretation may be adversely affected  Normal sinus rhythm  Left axis deviation  Incomplete right bundle branch block  Left ventricular hypertrophy with repolarization abnormality  Cannot rule out Septal infarct , age undetermined  Abnormal ECG  When compared with ECG of 20-Oct-2007 11:29,  Minimal criteria for Septal infarct are now Present  ST more depressed Lateral  leads  ----------unconfirmed----------  Confirmed by OVERREAD, NOT ([id number]), editor PEARSON, BARBARA ([id number]) on 01/26/2011 10:58:32 AM   ED ECG     31-Dec-12 16:56, Echo Doppler   Echo Doppler    Interpretation Summary    The left ventricle is not well visualized. The left ventricle is   grossly normal size. There is no thrombus. Left ventricularsystolic   function is normal. Ejection Fraction = >55%. There is mild   concentric left ventricular hypertrophy. The left ventricular wall   motion is normal. The right ventricular systolic function is normal.    Procedure:    A two-dimensional transthoracic echocardiogram with color flow and   Doppler was performed.    The study was completed bedside.    Left Ventricle    There is mild concentric left ventricular hypertrophy.    Left ventricular systolic function is normal.    Ejection Fraction = >55%.    The left ventricle is not well visualized.    The left ventricle is grossly normal size.    There is no thrombus.    The left ventricular wall motion is normal.    Right Ventricle    The right ventricle is not well visualized.    Borderline right ventricular enlargement.    There is normal right ventricular wall thickness.    The right ventricular systolic function is normal.    Atria    The left atrium is mildly dilated.    Borderline right atrial enlargement.    Mitral Valve    The mitral valve leaflets appear thickened, but open well.    There is mild mitral regurgitation.    Tricuspid Valve    There is mild tricuspid regurgitation.    The tricuspid valve is not well visualized.    Aortic Valve    The aortic valve is normal in structure and function.    No aortic regurgitation is present.    Pulmonic Valve    The pulmonicvalve is not well seen, but is grossly normal.    There is no pulmonic valvular regurgitation.    Great Vessels    The aortic root is not well visualized but is probably normal  size.    Pericardium/Pleural    No pericardial effusion.    There is no pleural effusion.    MMode 2D Measurements and Calculations    IVSd: 1.3 cm    LVIDd: 4.1 cm    LVIDs: 2.8 cm    LVPWd: 1.7 cm    FS: 31 %    EF(Teich): 59 %    LA dimension: 4.3 cm    LVOT diam: 2.0 cm    Doppler Measurements and Calculations    MV E point: 92 cm/sec    MV A point: 66 cm/sec    MV E/A: 1.4     MV dec time: 0.24 sec    Ao V2 max: 155 cm/sec    Ao max PG: 10 mmHg  AVA(V,D): 2.0 cm2    LV max PG: 4.0 mmHg    LV V1 max: 101 cm/sec    TR Max vel: 197 cm/sec    TR Max PG: 16 mmHg    RVSP: 21 mmHg    RAP systole: 5.0 mmHg    Reading Physician: Lujean Amel   Sonographer: Sherrie Sport  Interpreting Physician:  Lujean Amel,  electronically signed on   01-27-2011 13:53:10  Requesting Physician: Lujean Amel   Assessment/Plan:  Invasive Device Daily Assessment of Necessity:   Does the patient currently have any of the following indwelling devices? none   Assessment/Plan:   Assessment IMP Resp Failure CHF SOB Edema HTN DM CVA CAD COPD OSA AFIB CRI    Plan PLAN Consider pul input Increase activity Tele Lasix IV Bp control 02 DM control ROMI Agree with ECHO Support stockings Anticoug CPAP for OSA Renal input for CRI   Electronic Signatures: Yolonda Kida (MD)  (Signed 04-Jan-13 23:17)  Authored: Chief Complaint, VITAL SIGNS/ANCILLARY NOTES, Brief Assessment, Lab Results, Radiology Results, Assessment/Plan   Last Updated: 04-Jan-13 23:17 by Lujean Amel D (MD)

## 2014-05-20 NOTE — H&P (Signed)
PATIENT NAME:  Susan Graham, Susan Graham MR#:  [id number] DATE OF BIRTH:  1945/10/12  DATE OF ADMISSION:  07/22/2011  PRIMARY MD: Dr. Brynda Greathouse   ED REFERRING DOCTOR: Dr. Liana Gerold    CHIEF COMPLAINT: Shortness of breath.   HISTORY OF PRESENT ILLNESS: The patient is a 69 year old white female with history of diastolic CHF, COPD, sleep apnea who also has coronary artery disease who presents with progressive shortness of breath. The patient was actually seen in the ED last week when she came in with shortness of breath. She had a CT scan of her chest done which showed no pulmonary embolism. She was noted to have some pleural effusion and some enlarged lymph nodes. She was discharged home and diagnosed with a urinary tract infection, was discharged on p.o. antibiotics. She returns back with complaint of having shortness of breath. The patient has chronic shortness of breath and has progressively gotten worse. She gets short of breath even at rest now. She has been admitted multiple times for diastolic CHF. The patient does have nocturnal dyspnea and she sleeps propped up. She also has lower extremity swelling. She denies any chest pains or palpitations. She is not sure if she has had any fevers. Denies any coughing or wheezing. Denies any abdominal pain, nausea, vomiting, or diarrhea. Denies any urinary frequency, urgency, or hesitancy. Denies any calf pain.   PAST MEDICAL HISTORY:  1. History of coronary artery disease, status post CABG in 2008 at [facility].  2. History of diastolic CHF with EF 36% in January.  3. History of chronic obstructive pulmonary disease.  4. History of poorly controlled systemic hypertension.  5. Diabetes type II.  6. Morbid obesity.  7. History of pseudotumor cerebri.  8. History of bipolar disorder/anxiety disorder.  9. 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  10. Hypothyroidism.  11. Gastroesophageal reflux disease.   12. History of smoking.  13. History of restless leg syndrome.  14. Sleep apnea, uses CPAP at nighttime.  PAST SURGICAL HISTORY:  1. Status post coronary artery bypass graft.  2. Status post hysterectomy.  3. Status post left knee surgery.   ALLERGIES: Codeine, Levaquin, Risperdal, and sulfa.   CURRENT MEDICATIONS:  1. Advair Diskus 250/50 INH 1 puff b.i.d.  2. Amlodipine 10 daily.  3. Aspirin 325 p.o. daily.  4. Benztropine 1 mg daily.  5. Hydralazine 25 1 tab p.o. t.i.d.  6. Isosorbide mononitrate 30 mg daily.  7. Lantus 65 units at bedtime.  8. Levothyroxine 75 mcg daily.   9. Metoprolol tartrate 100 mg daily.  10. Percocet 5/325 mg 1 tab p.o. q.4 p.r.n.  11. The patient is also for some reason on NovoLog 70/30 50 units daily. Not sure if this is accurate because she is on Lantus. 12. Plavix 75 p.o. daily.  13. Pramipexole 0.5 1 tab p.o. daily.  14. Pravastatin 20 daily.  15. Spiriva 18 mcg daily.  16. Thiothixene 2 mg at bedtime.  17. Vasotec 2.5 daily.  18. Venlafaxine 75 mg daily.   SOCIAL HISTORY: She used to smoke, quit in 2008. No history of alcohol or drug abuse.   FAMILY HISTORY: Mother suffered from congestive heart failure as well as her father with the same two brothers with coronary artery disease status post CABG.  REVIEW OF SYSTEMS: CONSTITUTIONAL: No fevers. Complains of fatigue, weakness, not sure of any weight gain. Has chronic pain. EYES: Denies any blurred or double vision. No redness. No inflammation. No glaucoma.  No cataracts. ENT: No tinnitus. No ear pain. No hearing loss. No seasonal or year round allergies. No nasal discharge. No difficulty swallowing. RESPIRATORY: No cough. No wheezing. Does have history of COPD. Has chronic dyspnea. CARDIOVASCULAR: Denies any chest pain. Has edema. No arrhythmias. No palpitations. No syncope. GI: No nausea, vomiting, diarrhea. No abdominal pain. No hematemesis. No melena. No ulcer. No gastroesophageal reflux disease.  No irritable bowel syndrome. No jaundice. GU: Denies any dysuria, hematuria, renal calculus, or frequency. ENDOCRINE: Denies any polydipsia, nocturia, or thyroid problems. HEME/LYMPH: Denies anemia, easy bruisability, or bleeding. SKIN: No acne. No rash. No changes in mole, hair or skin. MUSCULOSKELETAL: Has pain related to osteoarthritis. No gout. NEUROLOGIC: Has some right-sided weakness as a result of previous CVA. No seizures. PSYCHIATRIC: No anxiety. No insomnia. No ADD.   PHYSICAL EXAMINATION:   VITAL SIGNS: Temperature 98.4, pulse 61, respirations 16, blood pressure 134/57, O2 96%.   GENERAL: The patient is an obese Caucasian female in mild respiratory distress.   HEENT: Head atraumatic, normocephalic. Pupils equally round, reactive to light and accommodation. There is no scleral icterus. No conjunctival pallor. Nasal exam shows no drainage or ulceration. Oropharynx is clear without any exudates.   NECK: There is no JVD. No carotid bruits. No thyromegaly.    CARDIOVASCULAR: Regular rate and rhythm. No murmurs, rubs, clicks, or gallops. PMI nondisplaced.   LUNGS: She has crackles at the bases. Some rhonchi.   ABDOMEN: Soft, nontender, nondistended. Positive bowel sounds x4.   EXTREMITIES: She has 1+ edema.   SKIN: No rash.   LYMPHATICS: No lymph nodes palpable.   VASCULAR: Good DP, PT pulses.   PSYCHIATRIC: Not anxious or depressed.   NEUROLOGICAL: Cranial nerves II through XII grossly intact. She has some right-sided weakness.   EVALUATIONS: BNP is 110[address]. Glucose 134, BUN 50, creatinine 2.24, sodium 136, potassium 4.9, chloride 103, CO2 25, calcium 8.9. CPK 127. CK-MB. 6.4. Troponin 1.92. WBC 20.4, hemoglobin 10.9, platelet count 286.   Chest x-ray shows bilateral infiltrates consistent with CHF.   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  1. Shortness of breath, likely multifactorial, likely due to acute on chronic diastolic CHF, possible COPD exacerbation as well as possible pneumonia due to her WBC count being high. At this time I will go ahead and treat her with IV Lasix 40 mg b.i.d. Place her on nebulizers. Place her on IV steroids. Follow her respiratory status.  2. Acute on chronic diastolic CHF. Will place her on IV Lasix. Hold ACE inhibitor in light of her creatinine elevation.  3. Chronic kidney disease. The patient has had creatinine elevation that fluctuates from normal to 2.3 within the past year. Her renal function was normal during her recent ED evaluation. She does have chronic renal failure. At this time her elevation could be related to volume overload. Will give her IV Lasix. Follow her renal function. If renal function worsens, then she will need Nephrology evaluation.  4. Leukocytosis possibly due to pneumonia. Will place her on IV ceftriaxone and azithromycin. Also get a urinalysis.  5. Diabetes. Continue Lantus plus sliding scale.  6. Sleep apnea. Continue CPAP at bedtime.  7. Hypothyroidism. Continue levothyroxine. I will check a TSH.  8. Elevated troponin with some EKG changes concerning for possible acute MI. However, the troponin elevation could be due to demand ischemia as well as her chronic renal  failure. At this time will give her IV heparin for now. Cardiology evaluation in the a.m.   CODE STATUS: FULL CODE.       TIME SPENT: 45 minutes spent on this case.   ____________________________ Lafonda Mosses. Posey Pronto, MD shp:drc D: 07/22/2011 20:15:35 ET T: 07/23/2011 06:22:04 ET JOB#: [id number]  cc: Jazmene Racz H. Posey Pronto, MD, <Dictator> Mikeal Hawthorne. Brynda Greathouse, MD Alric Seton MD ELECTRONICALLY SIGNED 07/23/2011 16:40

## 2014-05-20 NOTE — Discharge Summary (Signed)
PATIENT NAME:  Susan Graham, Susan Graham MR#:  [id number] DATE OF BIRTH:  03-04-1945  DATE OF ADMISSION:  07/22/2011 DATE OF DISCHARGE:  07/26/2011  DISCHARGE DIAGNOSES: 1. Congestive heart failure exacerbation, diastolic. 2. Acute on chronic respiratory failure secondary to chronic obstructive pulmonary disease exacerbation.  3. Pneumonia.  4. Hypertension.  5. Diabetes mellitus type 2. 6. Chronic kidney disease, stage III. 7. Deconditioning.  8. Bipolar disorder. 9. Anxiety.  10. Gastroesophageal reflux disease.  11. Hypothyroidism.   MEDICATIONS TO BE STOPPED: NovoLog 70/30.  DISCHARGE MEDICATIONS: The patient will continue the following medications.  1. Levothyroxine 75 mcg p.o. daily.  2. Amlodipine 10 mg p.o. daily.  3. Vasotec 2.5 mg p.o. daily.  4. Plavix 75 mg daily. 5. Advair Diskus 250/50 one puff every 12 hours. 6. Aspirin 325 mg p.o. daily.  7. Benztropine 1 mg p.o. daily.  8. Hydralazine 25 mg p.o. three times daily. 9. Isosorbide mononitrate 30 mg p.o. daily.  10. Metoprolol 100 mg p.o. daily.  11. Pramipexole 0.5 mg once a day. 12. Pravastatin 20 mg p.o. daily.  13. Spiriva 18 mcg inhalation daily. 14. Percocet 5/325 mg every 4 hours p.r.n.  15. Thiothixene 2 mg at bedtime. 16. Venlafaxine 75 mg p.o. daily.   NEW MEDICATIONS:  1. Lantus 70 units daily.  2. Lasix 20 mg p.o. twice a day. 3. Albuterol nebulizer. 4. KCl 10 mEq p.o. daily. 5. Oxygen 2 liters nasal cannula.  6. Zithromax prescription.   CONSULTANTS: Lujean Amel, MD - Cardiology.  LABORATORY DATA: Troponins 1.30 on 07/23/2011. LDL 54, triglycerides 138, and cholesterol 110. Hemoglobin A1c 6.2. Electrolytes: Sodium 133, potassium 5.4, chloride 99, bicarbonate 25, BUN 55, creatinine 2.27, and glucose 226. WBC on 07/23/2011 was 17.8, hemoglobin 10.7, hematocrit 33.7, and platelets 288. Troponin 0.78 on 07/23/2011.   HOSPITAL COURSE:  1. Acute on chronic heart failure. The patient is a 69 year old  female with history of CHF,  diastolic heart failure, and coronary artery disease who in because of trouble breathing. Look at the history and physical for full details. She had pedal edema and orthopnea. On admission the patient's x-ray showed congestive heart failure and BNP was 11,096. She was started on IV Lasix along with nebulizers and steroids. The patient was also thought to have obstructive pulmonary disease exacerbation along with pneumonia because of x-ray showing congestive heart failure and also pneumonia. So she started to improve with nebulizers and antibiotics and Lasix. Initially Lasix was held because of worsening of renal function. The patient showed improvement and she was using oxygen only with CPAP but she did desaturate to around 78% while she was walking without oxygen and her saturations were around 81% on room air with exertion, so we discharged her on oxygen 2 liters by nasal cannula. On 2 liters oxygen saturation went up to 97. She is advised to continue the Spiriva, nebulizers and Zithromax was given to finish the course along with tapering course of steroid as well. For her congestive heart failure, she also was given a prescription for Lasix and she also has lisinopril. A prior echocardiogram showed ejection fraction of more than 55% and Dr. Clayborn Bigness has seen her and recommended no further intervention for her elevated troponins. Elevated troponins were thought to be secondary to myocardial demand because of congestive heart failure and chronic obstructive pulmonary disease. The patient did not have any chest pain or EKG changes. Initially she was on heparin, but that was stopped. 2. History of sleep apnea. Continue CPAP machine.  3. History of diabetes mellitus, type 2. Her sugars were very elevated during the course of hospitalization because of steroids. The patient was given Lantus to go home with along with checking the glucose with sliding scale. The patient also is on a diet.   4. Deconditioning. The patient was seen by physical therapy. They recommended home health and we will discharge her home with home health physical therapy.  5. Chronic kidney disease stage III, stable.  6. Non-ST-elevation myocardial infarction because of congestive heart failure and chronic obstructive pulmonary disease. The patient was seen by a cardiologist and did not recommend further intervention and she is already on aspirin and Plavix by Dr. Verl Blalock along with ACE inhibitors. The patient's ejection fraction was more than 55%.  7. Hypothyroidism. She was continued on Synthroid. 8. Anxiety and bipolar disorder. She was continued on home medication.   TIME SPENT ON DISCHARGE PREPARATION: More than 30 minutes. ____________________________ Epifanio Lesches, MD sk:slb D: 07/30/2011 12:38:06 ET T: 07/31/2011 13:58:35 ET JOB#: [id number]  cc: Epifanio Lesches, MD, <Dictator> Epifanio Lesches MD ELECTRONICALLY SIGNED 08/04/2011 9:40

## 2014-05-20 NOTE — Discharge Summary (Signed)
PATIENT NAME:  Susan Graham, EDGAR MR#:  [id number] DATE OF BIRTH:  January 01, 1946  DATE OF ADMISSION:  08/02/2011 DATE OF DISCHARGE:  08/04/2011  ADMISSION DIAGNOSES:  1. Acute on chronic respiratory failure, likely multifactorial due to diastolic heart failure, obstructive sleep apnea, and deconditioning. Hospital-acquired pneumonia very unlikely.  2. Acute on chronic diastolic heart failure.  3. Chronic kidney disease, stage III. 4. Leukocytosis from urinary tract infection. 5. Type 2 diabetes. 6. Sleep apnea.  7. Hypothyroidism.  8. Elevated troponin. 9. Hypertension.  10. Hyperlipidemia.  11. Depression.   CONSULTANTS: None.   LABORATORY, DIAGNOSTIC AND RADIOLOGIC DATA: Sodium 138, potassium 4.4, chloride 100, bicarbonate 29, BUN 38, creatinine 1.66, and glucose 143. Troponin 0.06 and troponin at discharge 0.05.   Blood cultures negative to date.   Chest x-ray is consistent with pulmonary edema.  HOSPITAL COURSE: This is a 69 year old female who presented with shortness of breath. For further details, please refer to the History and Physical. 1. Acute on chronic respiratory failure secondary to diastolic heart failure acute on chronic, obstructive sleep apnea, and deconditioning. Hospital-acquired pneumonia was less likely. Chest x-ray was consistent with pulmonary edema. She had no symptoms of hospital-acquired pneumonia, although she was on antibiotics. These have been discontinued for treatment of hospital-acquired pneumonia. She was diuresed well and her shortness of breath has resolved. She is on 4 liters of oxygen.  2. Acute on chronic diastolic heart failure. Echo from December 2012 showed normal ejection fraction with mild left ventricular hypertrophy. She was diuresed with IV Lasix. She is euvolemic and doing well. Her creatinine has improved.  3. Chronic kidney disease stage III. In the past, the patient has had a creatinine of 2.3 and it is 1.66 today at discharge.   4. Leukocytosis secondary to urinary tract infection. The patient will be discharged with Keflex for the remainder of treatment for urinary tract infection.  5. Diabetes. The patient will continue her outpatient medications.  6. Sleep apnea, on CPAP.  7. Hypothyroidism, on Synthroid.  8. Elevated troponin, very minimally elevated at 0.06.  9. Hypertension. On Imdur, Vasotec, and metoprolol.   DISCHARGE MEDICATIONS:  1. Synthroid 75 mcg daily.  2. Norvasc 10 mg daily.  3. Vasotec 2.5 mg daily.  4. Aspirin 325 mg daily. 5. Benztropine 1 mg daily.  6. Hydralazine 25 mg three times daily. 7. Imdur 30 mg daily.  8. Metoprolol 100 mg daily. 9. Pramipexole 0.5 mg daily.  10. Pravastatin 20 mg daily. 11. Percocet 5/325 mg every four hours p.r.n. pain.  12. Thiothixene 2 mg at bedtime.  13. Plavix 75 mg daily.  14. Lantus 50 units at bedtime.  15. Effexor 75 mg daily.  16. Spiriva 18 mcg daily.  17. Ranitidine 150 mg daily.  18. NovoLog mix 70/30, 50 units daily.  19. Symbicort 2 puffs twice a day. 20. Keflex 500 mg p.o. every 8 hours for four days.  21. Lasix 40 mg daily.   DISCHARGE OXYGEN: 2 liters nasal cannula.   DISCHARGE DIET: Low sodium, ADA diet.   DISCHARGE ACTIVITY: As tolerated.   DISCHARGE FOLLOWUP: Followup with Dr. Nicky Pugh in one week.  TIME SPENT: 35 minutes.  ____________________________ Donell Beers. Benjie Karvonen, MD spm:slb D: 08/04/2011 12:51:38 ET T: 08/04/2011 14:38:43 ET JOB#: [id number]  cc: Dinnis Rog P. Benjie Karvonen, MD, <Dictator> Donell Beers Merridy Pascoe MD ELECTRONICALLY SIGNED 08/05/2011 13:37

## 2014-05-20 NOTE — Consult Note (Signed)
PATIENT NAME:  Susan Graham, CAETANO MR#:  [id number] DATE OF BIRTH:  Oct 01, 1945  DATE OF CONSULTATION:  07/23/2011  REFERRING PHYSICIAN:   CONSULTING PHYSICIAN:  Dwayne D. Clayborn Bigness, MD  PRIMARY CARE PHYSICIAN: Dr. Brynda Greathouse   INDICATION: Shortness of breath, history of congestive heart failure, and coronary artery disease.   HISTORY OF PRESENT ILLNESS: Susan Graham is a 69 year old obese white female with multiple medical problems including congestive heart failure, COPD, obstructive sleep apnea, coronary artery disease, CVA, diabetes, morbid obesity, bipolar, anxiety disorder who presented with worsening shortness of breath over the last several days. CT scan of her chest showed no pulmonary embolism but showed some pleural effusion and large nodes. She had been treated for a urinary tract infection but returned with worsening shortness of breath which had gotten progressively worse and the concern was diastolic dysfunction versus pneumonia. She had had some lower extremity swelling as well but no clear evidence of DVT, minimal chest pain except for chest tightness from her breathing.   REVIEW OF SYSTEMS: No blackout spells. No syncope. No nausea. No vomiting. No fever. No chills. No sweats. No weight loss. No weight gain. No hemoptysis or hematemesis. No bright red blood per rectum. She has had shortness of breath, cough, congestion.   PAST MEDICAL HISTORY:  1. Coronary artery disease. 2. Congestive heart failure with diastolic dysfunction. 3. Chronic obstructive pulmonary disease. 4. Hypertension. 5. Diabetes. 6. Morbid obesity. 7. Pseudotumor cerebri. 8. Bipolar/anxiety disorder. 9. Cerebrovascular accident. 10. Hypothyroidism. 11. Reflux. 12. Smoking. 13. Restless leg syndrome. 14. Obstructive sleep apnea.   PAST SURGICAL HISTORY:  1. Coronary artery bypass surgery.  2. Hysterectomy.  3. Knee surgery.  4. Cardiac catheterization.   ALLERGIES: Codeine, Levaquin, Risperdal,  sulfa.  MEDICATIONS: 1. Advair Diskus 250/50 twice a day.  2. Amlodipine 10 a day.  3. Aspirin 325 mg a daily.  4. Benztropine 1 mg daily.  5. Hydralazine 25 t.i.d.  6. Imdur 30 a day.  7. Lantus 65 units at bedtime.  8. Levothyroxine 75 daily.  9. Metoprolol 100 mg daily.  10. Percocet 5/325 q.4 p.r.n.  11. NovoLog 70/30 50 units daily.  12. Plavix 75 a day.  13. Pramipexole 0.5 1 tablet daily.  14. Pravastatin 20 a day.  15. Spiriva 18 mcg daily.  16. Thiothixene 2 mg at bedtime.  17. Vasotec 2.5 daily. 18. Venlafaxine 75 mg a day.   SOCIAL HISTORY: Quit smoking. Lives at home. No alcohol consumption.   FAMILY HISTORY: Congestive heart failure, coronary artery disease, myocardial infarction, hypertension, hyperlipidemia.   PHYSICAL EXAMINATION:   VITAL SIGNS: Blood pressure 135/60, pulse 60, respiratory rate 18, afebrile.   HEENT: Normocephalic, atraumatic. Pupils equal and reactive to light. She appears to be slightly dyspneic.   NECK: Supple. Mild JVD. No bruits or adenopathy.   LUNGS: Bilateral rhonchi. No rales. No wheezing. Adequate air movement.   HEART: Distant. Regular rate and rhythm. Positive S4. Systolic ejection murmur left sternal border. PMI nondisplaced.   ABDOMEN: Benign. Positive bowel sounds. No rebound, guarding, or tenderness.   EXTREMITIES: 2 to 3+ edema. Adequate pulses.   NEUROLOGIC: Intact.   SKIN: Normal.   LABORATORY, DIAGNOSTIC, AND RADIOLOGICAL DATA: BNP 11,000. Glucose 134, BUN 50, creatinine 2.24, sodium 136, potassium 4.9, chloride 103. CO2 25. CPK 127. MB 6.4. Troponin 1.92. White count 20, hemoglobin 10.9, platelet count 286.   Chest x-ray bilateral infiltrates, heart failure, possible pneumonia.   ASSESSMENT:  1. Shortness of breath.  2. Respiratory failure.  3. Possible pneumonia.  4. Congestive heart failure. 5. Diastolic dysfunction.  6. Renal insufficiency.  7. Elevated troponin.  8. Leukocytosis possibly related to  pneumonia.  9. Diabetes.  10. Obstructive sleep apnea.  11. Hypothyroidism.    PLAN:  1. Agree with admit.  2. Place on telemetry.  3. Follow-up troponins.  4. Follow-up EKGs.  5. Continue telemetry.  6. Agree with antibiotic therapy for possible pneumonia.  7. Inhalers would be helpful.  8. Mild diuresis with Lasix. 9. Continue CPAP machine if possible. 10. Continue diabetes management.  11. Recommend weight loss and exercise if possible.  12. Continue thyroid medication.  13. Consider pulmonary input. She may need steroid therapy and inhalers to help with her symptoms.  14. Follow-up renal insufficiency. Will consider whether Nephrology would be helpful in that regard. 15. Continue to treat the patient medically and hopefully she will respond.    ____________________________ Loran Senters. Clayborn Bigness, MD ddc:drc D: 07/24/2011 08:13:43 ET T: 07/24/2011 10:00:16 ET JOB#: [id number]  cc: Dwayne D. Clayborn Bigness, MD, <Dictator> Yolonda Kida MD ELECTRONICALLY SIGNED 07/25/2011 11:51

## 2014-05-20 NOTE — Consult Note (Signed)
Chief Complaint:   Subjective/Chief Complaint Lethargic sleepy but arousable.Pt still sob now no cp. No f/c/s.She still has leg edema.   VITAL SIGNS/ANCILLARY NOTES: **Vital Signs.:   03-Jan-13 13:45   Vital Signs Type Routine   Temperature Temperature (F) 98.1   Celsius 36.7   Temperature Source oral   Pulse Pulse 66   Pulse source per Dinamap   Respirations Respirations 22   Systolic BP Systolic BP 413   Diastolic BP (mmHg) Diastolic BP (mmHg) 62   Mean BP 78   BP Source Dinamap   Pulse Ox % Pulse Ox % 94   Pulse Ox Activity Level  At rest   Oxygen Delivery 2L  *Intake and Output.:   Daily 03-Jan-13 07:00   Grand Totals Intake:  960 Output:  1050    Net:  -90 24 Hr.:  -90   Oral Intake      In:  960   Urine ml     Out:  1050   Length of Stay Totals Intake:  4304 Output:  4425    Net:  -121   Brief Assessment:   Cardiac Regular  murmur present  + LE edema  + JVD    Respiratory normal resp effort  rhonchi  crackles    Gastrointestinal Normal    Gastrointestinal details normal Soft  Nontender  Nondistended  No masses palpable  No rebound tenderness   Routine Hem:  03-Jan-13 05:11    WBC (CBC) 18.4   RBC (CBC) 4.53   Hemoglobin (CBC) 13.0   Hematocrit (CBC) 39.1   Platelet Count (CBC) 299   MCV 86   MCH 28.6   MCHC 33.2   RDW 13.6   Neutrophil % 92.6   Lymphocyte % 3.4   Monocyte % 4.0   Eosinophil % 0.0   Basophil % 0.0   Neutrophil # 17.0   Lymphocyte # 0.6   Monocyte # 0.7   Eosinophil # 0.0   Basophil # 0.0  Routine Chem:  03-Jan-13 05:11    Glucose, Serum 327   BUN 77   Creatinine (comp) 2.02   Sodium, Serum 130   Potassium, Serum 4.5   Chloride, Serum 93   CO2, Serum 27   Calcium (Total), Serum 9.0   Osmolality (calc) 296   eGFR (African American) 32   eGFR (Non-African American) 26   Anion Gap 10   Radiology Results: XRay:    30-Dec-12 05:41, Chest Portable Single View   Chest Portable Single View    REASON FOR EXAM:     sob  COMMENTS:       PROCEDURE: DXR - DXR PORTABLE CHEST SINGLE VIEW  - Jan 25 2011  5:41AM     RESULT: Comparison: 10/20/2007    Findings:     Single portable AP chest radiograph is provided. There is mild bilateral   interstitialthickening. There is no focal parenchymal opacity, pleural   effusion, or pneumothorax. Normal cardiomediastinal silhouette. There is   evidence of prior CABG. The osseous structures are unremarkable.    IMPRESSION:   No acute disease of the chest.          Verified By: Jennette Banker, M.D., MD    01-Jan-13 12:52, Chest PA and Lateral   Chest PA and Lateral    REASON FOR EXAM:    f/u pul edema  COMMENTS:       PROCEDURE: DXR - DXR CHEST PA (OR AP) AND LATERAL  - Jan 27 2011  12:52PM     RESULT: Comparison: 01/25/2011    Findings:  The lung volumes are low. Cardiomegaly and the mediastinum are similar to   prior. There are mild interstitial pulmonary opacities. These are solid   more conspicuous than prior. Linear opacity in the left lateral lung   likely secondary to atelectasis or scarring. The lung volumes are low.   Evaluation of the lateral view is limited by respiratory motion. The   posterior thorax is excluded from the field-of-view on the repeat lateral   image.  IMPRESSION:   Mild interstitial pulmonary opacities may be secondary to atelectasis   from the low lung volumes. Interstitial pulmonary edema is a differential   consideration.          Verified By: Gregor Hams, M.D., MD  Lab:    30-Dec-12 05:30, ABG   pH (ABG) 7.34   PCO2 45   PO2 419   FiO2 100   Base Excess -1.7   HCO3 24.3   O2 Saturation 100.0   O2 Device BIPAP   Specimen Site (ABG)    RT RADIAL   Specimen Type (ABG) ARTERIAL   Patient Temp (ABG) 37.0   PSV 12   PEEP 6.0   Mechanical Rate 10    30-Dec-12 06:20, ABG and Lactic Acid   pH (ABG) 7.36   PCO2 44   PO2 82   FiO2 30   Base Excess -0.8   HCO3 24.9   O2 Saturation 95.5   O2 Device V-60    Specimen Site (ABG)    RT RADIAL   Specimen Type (ABG) ARTERIAL   Patient Temp (ABG) 37.0   PSV 12   PEEP 6.0   Mechanical Rate 10   Result(s) reported on 25 Jan 2011 at 06:18AM.   Lactic Acid, Cardiopulmonary 1.3   Result(s) reported on 25 Jan 2011 at 06:18AM.    30-Dec-12 10:45, ABG   pH (ABG) 7.38   PCO2 44   PO2 95   FiO2 30   Base Excess 0.5   HCO3 26.0   O2 Saturation 97.2   O2 Device BIPAP   Specimen Site (ABG)    RT RADIAL   Specimen Type (ABG) ARTERIAL   Patient Temp (ABG) 37.0   PSV 12   PEEP 6.0   Mechanical Rate 10   Result(s) reported on 25 Jan 2011 at 11:07AM.  Cardiology:    30-Dec-12 05:33, ED ECG   Ventricular Rate 79   Atrial Rate 79   P-R Interval 152   QRS Duration 106   QT 394   QTc 451   P Axis 53   R Axis -50   T Axis 137   ECG interpretation    *** Poor data quality, interpretation may be adversely affected  Normal sinus rhythm  Left axis deviation  Incomplete right bundle branch block  Left ventricular hypertrophy with repolarization abnormality  Cannot rule out Septal infarct , age undetermined  Abnormal ECG  When compared with ECG of 20-Oct-2007 11:29,  Minimal criteria for Septal infarct are now Present  ST more depressed Lateral leads  ----------unconfirmed----------  Confirmed by OVERREAD, NOT ([id number]), editor PEARSON, BARBARA ([id number]) on 01/26/2011 10:58:32 AM   ED ECG     31-Dec-12 16:56, Echo Doppler   Echo Doppler    Interpretation Summary    The left ventricle is not well visualized. The left ventricle is   grossly normal size. There is no thrombus. Left ventricular systolic   function  is normal. Ejection Fraction = >55%. There is mild   concentric left ventricular hypertrophy. The left ventricular wall   motion is normal. The right ventricular systolic function is normal.    Procedure:    A two-dimensional transthoracicechocardiogram with color flow and   Doppler was performed.    The study was completed  bedside.    Left Ventricle    There is mild concentric left ventricular hypertrophy.    Left ventricular systolic function is normal.    Ejection Fraction = >55%.    The left ventricle is not well visualized.    The left ventricle is grossly normal size.    There is no thrombus.    The left ventricular wall motion is normal.    Right Ventricle    The right ventricle is not well visualized.    Borderline right ventricular enlargement.    There is normal right ventricular wall thickness.    The right ventricular systolic function is normal.    Atria    The left atrium is mildly dilated.    Borderline right atrial enlargement.    Mitral Valve    The mitral valve leaflets appear thickened, but open well.    There is mild mitral regurgitation.    Tricuspid Valve    There is mild tricuspid regurgitation.    The tricuspid valve is not well visualized.    Aortic Valve    The aortic valve is normal in structure and function.    No aortic regurgitation is present.    Pulmonic Valve    The pulmonicvalve is not well seen, but is grossly normal.    There is no pulmonic valvular regurgitation.    Great Vessels    The aortic root is not well visualized but is probably normal size.    Pericardium/Pleural    No pericardial effusion.    There is no pleural effusion.    MMode 2D Measurements and Calculations    IVSd: 1.3 cm    LVIDd: 4.1 cm    LVIDs: 2.8 cm    LVPWd: 1.7 cm    FS: 31 %    EF(Teich): 59 %    LA dimension: 4.3 cm    LVOT diam: 2.0 cm    Doppler Measurements and Calculations    MV E point: 92 cm/sec    MV A point: 66 cm/sec    MV E/A: 1.4     MV dec time: 0.24 sec    Ao V2 max: 155 cm/sec    Ao max PG: 10 mmHg    AVA(V,D): 2.0 cm2    LV max PG: 4.0 mmHg    LV V1 max: 101 cm/sec    TR Max vel: 197 cm/sec    TR Max PG: 16 mmHg    RVSP: 21 mmHg    RAP systole: 5.0 mmHg    Reading Physician: Lujean Amel   Sonographer: Sherrie Sport  Interpreting  Physician:  Lujean Amel,  electronically signed on   01-27-2011 13:53:10  Requesting Physician: Lujean Amel   Assessment/Plan:  Invasive Device Daily Assessment of Necessity:   Does the patient currently have any of the following indwelling devices? none   Assessment/Plan:   Assessment IMP AMS CHF SOB Edema HTN DM CVA CAD COPD OSA AFIB CRI    Plan PLAN Renal consult R/O obstruction Tele Lasix IV Bp control 02 DM control ROMI Agree with ECHO Support stockings Anticoug CPAP for OSA Renal input for CRI Increase activity  Electronic Signatures: Lujean Amel D (MD)  (Signed 04-Jan-13 23:20)  Authored: Chief Complaint, VITAL SIGNS/ANCILLARY NOTES, Brief Assessment, Lab Results, Radiology Results, Assessment/Plan   Last Updated: 04-Jan-13 23:20 by Lujean Amel D (MD)

## 2014-05-20 NOTE — H&P (Signed)
PATIENT NAME:  Susan Graham, Susan Graham MR#:  [id number] DATE OF BIRTH:  11-Jan-1946  DATE OF ADMISSION:  01/25/2011  REFERRING PHYSICIAN: Dr. Cinda Quest   PRIMARY CARE PHYSICIAN: Nicky Pugh, MD    PRIMARY CARDIOLOGIST: Lujean Amel, MD   CHIEF COMPLAINT: Shortness of breath.   HISTORY OF PRESENT ILLNESS: The patient is a pleasant 69 year old female with past medical history listed below who presents to the Emergency Department with past medical history listed below including obesity, hypertension, diabetes mellitus, coronary artery disease status post MI and coronary artery bypass graft, history of CVA, hypothyroidism, gastroesophageal reflux disease, bipolar disorder and anxiety who presents to the Emergency Department with complaints of shortness of breath over the past two days which is worse with activity and associated with orthopnea and mild swelling in her legs. There has been no improvement of her shortness of breath even with rest. She still remains short of breath. She has also had intermittent chest pain over the past few days which is located in the right substernal region which is otherwise nonradiating and described as a sharp sensation in the right side of her chest, 8 out of 10 in intensity, over the past couple of days and episodes last anywhere from seconds to minutes and thereafter episodes usually resolve on their own. Currently she is chest pain free at the time of my evaluation. She also states that she's had subjective fever at home and her temperature was 99.9 at home. She reports cough productive of yellowish sputum. She denies chills. She denies any recent travel or sick contact exposure. She reports compliance with her home medications. Daughter accompanies her today and also provides additional history. Major complaint at this time appears to be shortness of breath. She came to the ER for further evaluation of her symptoms. She was noted to be hypoxic on room air in moderate  respiratory distress and placed on BiPAP with improvement of her shortness of bed thereafter. Blood pressure was 207/82 on arrival consistent with malignant hypertension. Chest x-ray shows increased interstitial lung markings bilaterally. Her BNP is elevated at 1646. Troponin is elevated at 0.16. Creatinine is elevated at 1.57. EKG shows normal sinus rhythm, left axis deviation with incomplete right bundle branch block, LVH, and repolarization abnormality. No prior EKGs for comparison in our system. She is currently afebrile with normal WBC count. Otherwise, she is without specific complaints at this time. Hospitalist services were contacted for further evaluation and for hospital admission.   PAST SURGICAL HISTORY:  1. Coronary artery bypass graft. 2. Hysterectomy.  3. Left knee surgery.   PAST MEDICAL HISTORY:  1. Hypertension. 2. Diabetes mellitus, insulin requiring. 3. History of obesity.  4. Status post treatment of pseudotumor cerebri.  5. Bipolar disorder. 6. Anxiety.  7. Coronary artery disease status post MI with coronary stents, also is status post CABG in 2007 at [facility].  8. 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 The patient has mild residual right hemiparesis. She is followed by home health and nursing.  9. Hypothyroidism.  10. History of gastroesophageal reflux disease.  11. History of former tobacco abuse.  12. 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   ALLERGIES: Codeine, Levaquin, Risperdal, sulfa.   HOME MEDICATIONS:  1. Aspirin 325 mg daily.  2. Plavix 75 mg daily.  3. Norvasc 5 mg daily. 4. Toprol-XL 100 mg daily.  5. Percocet 5/325 1 tab p.o. b.i.d. p.r.n.  6. Omeprazole 20 mg daily.  7. Lantus 50 units subcutaneously at bedtime.  8. Humalog mix 75/25 50 units subcutaneously q.a.m.  9. Pravastatin 20 mg daily. 10. Thiothixene 2 mg at bedtime.  11. Venlafaxine XR 75 mg daily.  12. Losartan/HCTZ 100/25 1 tablet  p.o. daily. 13. Benztropine 1 mg daily.  14. Taxol 5 mg at bedtime.  15. Pramipexole 5 mg at night for restless legs.  16. Synthroid 50 mcg daily.   FAMILY HISTORY: Strong family history of cardiac history. Mother with history of CHF. Father with history of CHF and MI. Two brothers with history of CABG.   SOCIAL HISTORY: Tobacco former abuse, none currently. Alcohol none. Illicit drugs none. The patient lives in [place], [facility] at home. She is followed by home health and nursing. She is divorced. She has two children. Her daughter accompanies her today.   REVIEW OF SYSTEMS: CONSTITUTIONAL: Reports subjective fevers. Denies chills. Denies fatigue. Has mild residual right-sided weakness after stroke which has been chronic. Denies pain at the moment. Denies any recent changes in weight. HEAD/EYES: Denies headache or blurry vision. ENT: Denies tinnitus, earache, nasal discharge, or sore throat. RESPIRATORY: Reports shortness of breath and cough. Denies wheezing or hemoptysis. CARDIOVASCULAR: Had some chest pain earlier which has since resolved. Reports orthopnea and mild lower extremity edema. Denies heart palpitations. GI: Denies nausea, vomiting, diarrhea, constipation, melena, hematochezia, abdominal pains. GU: Denies dysuria or hematuria. ENDOCRINE: Denies heat or cold intolerance. HEME/LYMPH: Denies easy bruising or bleeding. INTEGUMENTARY: Denies rash. MUSCULOSKELETAL: Denies joint pain or back pain currently. NEUROLOGIC: Denies headache, numbness, tingling, dysarthria. Has residual right-sided weakness after her stroke which has not changed recently. PSYCHIATRIC: Has underlying bipolar disorder and anxiety.   PHYSICAL EXAMINATION:   VITAL SIGNS: Temperature afebrile, pulse 68, blood pressure 207/82 on arrival, respirations 18, oxygen saturation 96% on BiPAP.   GENERAL: The patient is alert and oriented in mild respiratory distress.   HEENT: Normocephalic, atraumatic. Pupils equal,  round, and reactive to light and accommodation. Extraocular movements intact. Anicteric sclerae. Conjunctivae pink. Hearing intact to voice. Nares without drainage. Oral mucosa is moist. Difficult to fully inspect her oropharynx since BiPAP is applied.   NECK: Supple with full range of motion. No JVD, lymphadenopathy, or carotid bruits bilaterally. No thyromegaly or tenderness to palpation over thyroid gland.   CHEST: The patient is in mild respiratory distress but is currently not tachypneic and is not using her accessory respiratory muscles to breathe. She has bibasilar rales. No wheezing or rhonchi. No crackles.   CARDIOVASCULAR: S1, S2 positive. Regular rate and rhythm. No murmurs, rubs, or gallops. PMI is non-lateralized.   ABDOMEN: Obese, soft, nontender, nondistended. Normoactive bowel sounds. No hepatosplenomegaly or palpable masses. No hernias.   EXTREMITIES: Trace pedal edema of bilateral lower extremities without clubbing, cyanosis, erythema, or warmth. Pedal pulses are palpable bilaterally.   SKIN: No suspicious rash.   LYMPH: No cervical lymphadenopathy.  NEUROLOGIC: Alert and oriented x3. Cranial nerves II through XII grossly intact. She has mild residual right hemiparesis after her stroke in 2007 which has not recently changed, otherwise, no focal deficits.   PSYCH: Appropriate affect.   LABORATORY, DIAGNOSTIC, AND RADIOLOGICAL DATA: CBC within normal limits. CMP within normal limits except for BUN 30, creatinine 1.57, glucose 371, sodium 134, albumin 2.6, estimated GFR 35. Troponin 0.16. TSH 6.05. BNP 1646.   Portable chest x-ray mild bilateral interstitial thickening. No focal parenchymal opacity or pleural effusion. No pneumothorax. Prior evidence of CABG. Otherwise, there are no acute cardiopulmonary abnormalities other than  changes listed above. EKG normal sinus rhythm, 84 beats per minute with left axis deviation, incomplete right bundle branch block, LVH, and  repolarization abnormality. No prior EKGs for comparison in our system.   ASSESSMENT AND PLAN: This is a 69 year old female with history of obesity, hypertension, diabetes mellitus, CAD status post MI and CABG, history of CVA, hypothyroidism, gastroesophageal reflux disease, bipolar disorder, and anxiety here with shortness of breath with some orthopnea, lower extremity swelling, and shortness of breath worse with activity in addition to intermittent chest pain and also subjective fever with productive cough here with:  1. Acute hypoxic respiratory failure. Recommend hospital admission for further evaluation and management. Will admit patient to telemetry unit. Suspect her acute hypoxic respiratory failure is multifactorial but mostly from acute CHF (possibly diastolic) due to malignant hypertension but could also be a component from acute bronchitis. For now will keep patient on BiPAP and diurese her with Lasix for CHF. Keep her on antibiotics for bronchitis. Start blood pressure control for her malignant hypertension. Will follow her ABG. Will attempt to wean patient off BiPAP to nasal cannula and eventually to room air as tolerated. Further work-up and management to follow depending on the patient's clinical course. Please see below for further details.  2. Suspected acute diastolic CHF exacerbation suspected due to malignant hypertension. As above, will keep patient on BiPAP, IV Lasix, nitrate therapy in addition to beta-blocker. Hold ARB for now given acute renal failure. Will monitor ins and outs strictly and also obtain daily weights. Will monitor renal function closely while on diuretics. Will continue to cycle her cardiac enzymes. Obtain 2-D echocardiogram for further assessment and obtain Cardiology consultation for further recommendations. Will also increase her Synthroid dose given elevated TSH which could also be contributing to CHF. Further work-up and management to follow depending on the  patient's clinical course.  3. Malignant hypertension. Will initiate gradual blood pressure reduction with Lasix, Nitro patch, Norvasc, metoprolol and also write for p.r.n. IV hydralazine and monitor blood pressure closely. Will hold HCTZ since she will be on Lasix and is mildly hyponatremic. Will also hold Diovan given acute renal failure. Will monitor blood pressure closely.  4. Elevated troponin with abnormal EKG in patient with known CAD status post MI and CABG and also some intermittent chest pain. Elevated troponin could be demand ischemia from respiratory failure and CHF plus poor renal clearance in the setting of acute renal failure rather than acute coronary syndrome; however, it is unclear at this time and since the patient has had chest pain over the past two days along with known CAD will keep her on aspirin, Plavix, heparin drip, beta-blocker, nitrate, and statin therapy for now. Continue to cycle her cardiac enzymes. Obtain, 2-D echocardiogram. Cardiology consultation for further recommendations.  5. Acute renal failure, could be prerenal from CHF. We will await urinalysis. Will see if the patient's renal function improves with diuresis. Will monitor ins and outs strictly and avoid nephrotoxins. Hold Diovan and HCTZ for now. Consider Nephrology evaluation if renal function does not improve or worsens despite the above-mentioned measures. Currently her renal failure appears to be nonoliguric.   6. Possible acute bronchitis with subjective fever and productive cough. Will get blood and sputum cultures. Start empiric IV antibiotics in the form of Rocephin, azithromycin, and monitor clinical response and also write for p.r.n. bronchodilator therapy.  7. Diabetes mellitus, which is insulin requiring. Check hemoglobin A1c. Continue Humalog mix 75/25 in addition to Lantus and NovoLog sliding scale insulin  plus ADA diet.  8. History of CVA (at the time of CABG) with mild residual right hemiparesis.  Continue aspirin, Plavix, and statin therapy. Once her respiratory failure, CHF, malignant hypertension, and chest pain improve and are better addressed will obtain PT evaluation.  9. History of gastroesophageal reflux disease. Continue PPI.  10. History of bipolar disorder and anxiety. Continue thiothixene and Effexor and also write for p.r.n. Klonopin (low dose). 11. Hypothyroidism with elevated TSH. The patient takes 50 mcg of Synthroid per day at home. Will check FT3 and FT4 and increase Synthroid to 75 mcg per day for now given CHF.   12. Mild hyponatremia, suspect either from HCTZ versus hypervolemic from CHF. Will hold HCTZ for now and will diurese patient with Lasix instead and follow sodium levels closely. 13. History of restless leg syndrome. Continue Mirapex. 14. DVT prophylaxis. Heparin.  CODE STATUS: FULL CODE.  TIME SPENT ON THIS CRITICAL CARE ADMISSION: Approximately 65 minutes.   Plan of care was discussed with the patient as well as her daughter in the ER, both of whom are currently in agreement with the above-mentioned plan.   ____________________________ Romie Jumper, MD knl:[id number] D: 01/25/2011 08:54:40 ET T: 01/25/2011 11:22:44 ET JOB#: [id number]  cc: Romie Jumper, MD, <Dictator> Mikeal Hawthorne. Brynda Greathouse, MD Dwayne D. Clayborn Bigness, MD Romie Jumper MD ELECTRONICALLY SIGNED 01/29/2011 16:25

## 2014-05-20 NOTE — Consult Note (Signed)
Chief Complaint:   Subjective/Chief Complaint No cp but congestion and sob. No F/C/S.   VITAL SIGNS/ANCILLARY NOTES: **Vital Signs.:   02-Jan-13 08:33   Vital Signs Type Routine   Temperature Temperature (F) 98.2   Celsius 36.7   Temperature Source oral   Pulse Pulse 75   Pulse source per Dinamap   Respirations Respirations 20   Systolic BP Systolic BP 093   Diastolic BP (mmHg) Diastolic BP (mmHg) 62   Mean BP 100   BP Source Dinamap   Pulse Ox % Pulse Ox % 95   Pulse Ox Activity Level  At rest   Oxygen Delivery 2L  *Intake and Output.:   Daily 02-Jan-13 07:00   Grand Totals Intake:  1020 Output:  1200    Net:  -180 24 Hr.:  -180   Oral Intake      In:  720   IV (Secondary)      In:  300   Urine ml     Out:  1200   Length of Stay Totals Intake:  3344 Output:  3375    Net:  -31   Brief Assessment:   Cardiac Regular  murmur present  + LE edema  + JVD    Respiratory normal resp effort  postive use of accessory muscles  rhonchi  crackles    Gastrointestinal Normal    Gastrointestinal details normal Soft  Nontender  Nondistended  No masses palpable  No rebound tenderness   Routine Hem:  02-Jan-13 05:00    Hemoglobin (CBC) 12.9   Platelet Count (CBC) 251   Radiology Results: XRay:    30-Dec-12 05:41, Chest Portable Single View   Chest Portable Single View    REASON FOR EXAM:    sob  COMMENTS:       PROCEDURE: DXR - DXR PORTABLE CHEST SINGLE VIEW  - Jan 25 2011  5:41AM     RESULT: Comparison: 10/20/2007    Findings:     Single portable AP chest radiograph is provided. There is mild bilateral   interstitialthickening. There is no focal parenchymal opacity, pleural   effusion, or pneumothorax. Normal cardiomediastinal silhouette. There is   evidence of prior CABG. The osseous structures are unremarkable.    IMPRESSION:   No acute disease of the chest.          Verified By: Jennette Banker, M.D., MD    01-Jan-13 12:52, Chest PA and Lateral   Chest PA  and Lateral    REASON FOR EXAM:    f/u pul edema  COMMENTS:       PROCEDURE: DXR - DXR CHEST PA (OR AP) AND LATERAL  - Jan 27 2011 12:52PM     RESULT: Comparison: 01/25/2011    Findings:  The lung volumes are low. Cardiomegaly and the mediastinum are similar to   prior. There are mild interstitial pulmonary opacities. These are solid   more conspicuous than prior. Linear opacity in the left lateral lung   likely secondary to atelectasis or scarring. The lung volumes are low.   Evaluation of the lateral view is limited by respiratory motion. The   posterior thorax is excluded from the field-of-view on the repeat lateral   image.  IMPRESSION:   Mild interstitial pulmonary opacities may be secondary to atelectasis   from the low lung volumes. Interstitial pulmonary edema is a differential   consideration.          Verified By: Gregor Hams, M.D., MD  Lab:  30-Dec-12 05:30, ABG   pH (ABG) 7.34   PCO2 45   PO2 419   FiO2 100   Base Excess -1.7   HCO3 24.3   O2 Saturation 100.0   O2 Device BIPAP   Specimen Site (ABG)    RT RADIAL   Specimen Type (ABG) ARTERIAL   Patient Temp (ABG) 37.0   PSV 12   PEEP 6.0   Mechanical Rate 10    30-Dec-12 06:20, ABG and Lactic Acid   pH (ABG) 7.36   PCO2 44   PO2 82   FiO2 30   Base Excess -0.8   HCO3 24.9   O2 Saturation 95.5   O2 Device V-60   Specimen Site (ABG)    RT RADIAL   Specimen Type (ABG) ARTERIAL   Patient Temp (ABG) 37.0   PSV 12   PEEP 6.0   Mechanical Rate 10   Result(s) reported on 25 Jan 2011 at 06:18AM.   Lactic Acid, Cardiopulmonary 1.3   Result(s) reported on 25 Jan 2011 at 06:18AM.    30-Dec-12 10:45, ABG   pH (ABG) 7.38   PCO2 44   PO2 95   FiO2 30   Base Excess 0.5   HCO3 26.0   O2 Saturation 97.2   O2 Device BIPAP   Specimen Site (ABG)    RT RADIAL   Specimen Type (ABG) ARTERIAL   Patient Temp (ABG) 37.0   PSV 12   PEEP 6.0   Mechanical Rate 10   Result(s) reported on 25 Jan 2011 at  11:07AM.  Cardiology:    30-Dec-12 05:33, ED ECG   Ventricular Rate 79   Atrial Rate 79   P-R Interval 152   QRS Duration 106   QT 394   QTc 451   P Axis 53   R Axis -50   T Axis 137   ECG interpretation    *** Poor data quality, interpretation may be adversely affected  Normal sinus rhythm  Left axis deviation  Incomplete right bundle branch block  Left ventricular hypertrophy with repolarization abnormality  Cannot rule out Septal infarct , age undetermined  Abnormal ECG  When compared with ECG of 20-Oct-2007 11:29,  Minimal criteria for Septal infarct are now Present  ST more depressed Lateral leads  ----------unconfirmed----------  Confirmed by OVERREAD, NOT ([id number]), editor PEARSON, BARBARA ([id number]) on 01/26/2011 10:58:32 AM   ED ECG     31-Dec-12 16:56, Echo Doppler   Echo Doppler    Interpretation Summary    The left ventricle is not well visualized. The left ventricle is   grossly normal size. There is no thrombus. Left ventricular systolic   function is normal. Ejection Fraction = >55%. There is mild   concentric left ventricular hypertrophy. The left ventricular wall   motion is normal. The right ventricular systolic function is normal.    Procedure:    A two-dimensional transthoracic echocardiogram with color flow and   Doppler was performed.    The study was completed bedside.    Left Ventricle    There is mild concentric left ventricular hypertrophy.    Left ventricular systolic function is normal.    Ejection Fraction = >55%.    The left ventricle is not well visualized.    The left ventricle is grossly normal size.    There is no thrombus.    The left ventricular wall motion is normal.    Right Ventricle    The right ventricle is not well visualized.  Borderline right ventricular enlargement.    There is normal right ventricular wall thickness.    The right ventricular systolic function is normal.    Atria    The left atrium is mildly dilated.     Borderline right atrial enlargement.    Mitral Valve    The mitral valve leaflets appear thickened, but open well.    There is mild mitral regurgitation.    Tricuspid Valve    There is mild tricuspid regurgitation.    The tricuspid valve is not well visualized.    Aortic Valve    The aortic valve is normal in structure and function.    No aortic regurgitation is present.    Pulmonic Valve    The pulmonicvalve is not well seen, but is grossly normal.    There is no pulmonic valvular regurgitation.    Great Vessels    The aortic root is not well visualized but is probably normal size.    Pericardium/Pleural    No pericardial effusion.    There is no pleural effusion.    MMode 2D Measurements and Calculations    IVSd: 1.3 cm    LVIDd: 4.1 cm    LVIDs: 2.8 cm    LVPWd: 1.7 cm    FS: 31 %    EF(Teich): 59 %    LA dimension: 4.3 cm    LVOT diam: 2.0 cm    Doppler Measurements and Calculations    MV E point: 92 cm/sec    MV A point: 66 cm/sec    MV E/A: 1.4     MV dec time: 0.24 sec    Ao V2 max: 155 cm/sec    Ao max PG: 10 mmHg    AVA(V,D): 2.0 cm2    LV max PG: 4.0 mmHg    LV V1 max: 101 cm/sec    TR Max vel: 197 cm/sec    TR Max PG: 16 mmHg    RVSP: 21 mmHg    RAP systole: 5.0 mmHg    Reading Physician: Lujean Amel   Sonographer: Sherrie Sport  Interpreting Physician:  Lujean Amel,  electronically signed on   01-27-2011 13:53:10  Requesting Physician: Lujean Amel   Assessment/Plan:  Invasive Device Daily Assessment of Necessity:   Does the patient currently have any of the following indwelling devices? none   Assessment/Plan:   Assessment IMP CHF SOB Edema HTN DM CVA CAD COPD OSA AFIB CRI    Plan PLAN Tele Lasix IV Bp control 02 DM control ROMI Agree with ECHO Support stockings Anticoug CPAP for OSA Renal input for CRI Increase activity   Electronic Signatures: Lujean Amel D (MD)  (Signed 04-Jan-13  23:22)  Authored: Chief Complaint, VITAL SIGNS/ANCILLARY NOTES, Brief Assessment, Lab Results, Radiology Results, Assessment/Plan   Last Updated: 04-Jan-13 23:22 by Lujean Amel D (MD)

## 2014-07-11 DIAGNOSIS — E039 Hypothyroidism, unspecified: Secondary | ICD-10-CM

## 2014-07-11 DIAGNOSIS — I5030 Unspecified diastolic (congestive) heart failure: Secondary | ICD-10-CM | POA: Diagnosis not present

## 2014-07-11 DIAGNOSIS — M1990 Unspecified osteoarthritis, unspecified site: Secondary | ICD-10-CM

## 2014-07-11 DIAGNOSIS — F3162 Bipolar disorder, current episode mixed, moderate: Secondary | ICD-10-CM

## 2014-07-11 DIAGNOSIS — I2510 Atherosclerotic heart disease of native coronary artery without angina pectoris: Secondary | ICD-10-CM | POA: Diagnosis not present

## 2014-07-11 DIAGNOSIS — E1165 Type 2 diabetes mellitus with hyperglycemia: Secondary | ICD-10-CM | POA: Diagnosis not present

## 2014-07-11 DIAGNOSIS — J449 Chronic obstructive pulmonary disease, unspecified: Secondary | ICD-10-CM

## 2014-08-17 DIAGNOSIS — B351 Tinea unguium: Secondary | ICD-10-CM

## 2014-08-17 DIAGNOSIS — E1169 Type 2 diabetes mellitus with other specified complication: Secondary | ICD-10-CM | POA: Diagnosis not present

## 2014-08-31 ENCOUNTER — Other Ambulatory Visit: Payer: Self-pay

## 2014-08-31 ENCOUNTER — Telehealth: Payer: Self-pay | Admitting: Family Medicine

## 2014-08-31 ENCOUNTER — Encounter: Payer: Self-pay | Admitting: Emergency Medicine

## 2014-08-31 ENCOUNTER — Emergency Department: Payer: Medicare Other

## 2014-08-31 ENCOUNTER — Observation Stay
Admission: EM | Admit: 2014-08-31 | Discharge: 2014-09-01 | Disposition: A | Discharge status: Skilled Nursing Facility | Payer: Medicare Other | Attending: Internal Medicine | Admitting: Internal Medicine

## 2014-08-31 DIAGNOSIS — I509 Heart failure, unspecified: Secondary | ICD-10-CM | POA: Diagnosis not present

## 2014-08-31 DIAGNOSIS — F419 Anxiety disorder, unspecified: Secondary | ICD-10-CM | POA: Diagnosis not present

## 2014-08-31 DIAGNOSIS — I5030 Unspecified diastolic (congestive) heart failure: Secondary | ICD-10-CM | POA: Diagnosis not present

## 2014-08-31 DIAGNOSIS — L8990 Pressure ulcer of unspecified site, unspecified stage: Secondary | ICD-10-CM | POA: Insufficient documentation

## 2014-08-31 DIAGNOSIS — Z885 Allergy status to narcotic agent status: Secondary | ICD-10-CM | POA: Diagnosis not present

## 2014-08-31 DIAGNOSIS — I208 Other forms of angina pectoris: Secondary | ICD-10-CM

## 2014-08-31 DIAGNOSIS — Z7982 Long term (current) use of aspirin: Secondary | ICD-10-CM | POA: Diagnosis not present

## 2014-08-31 DIAGNOSIS — R29810 Facial weakness: Secondary | ICD-10-CM | POA: Insufficient documentation

## 2014-08-31 DIAGNOSIS — N189 Chronic kidney disease, unspecified: Secondary | ICD-10-CM | POA: Diagnosis not present

## 2014-08-31 DIAGNOSIS — E039 Hypothyroidism, unspecified: Secondary | ICD-10-CM | POA: Diagnosis not present

## 2014-08-31 DIAGNOSIS — Z8249 Family history of ischemic heart disease and other diseases of the circulatory system: Secondary | ICD-10-CM | POA: Diagnosis not present

## 2014-08-31 DIAGNOSIS — R0602 Shortness of breath: Secondary | ICD-10-CM | POA: Insufficient documentation

## 2014-08-31 DIAGNOSIS — I25119 Atherosclerotic heart disease of native coronary artery with unspecified angina pectoris: Secondary | ICD-10-CM | POA: Diagnosis not present

## 2014-08-31 DIAGNOSIS — E785 Hyperlipidemia, unspecified: Secondary | ICD-10-CM | POA: Diagnosis not present

## 2014-08-31 DIAGNOSIS — I130 Hypertensive heart and chronic kidney disease with heart failure and stage 1 through stage 4 chronic kidney disease, or unspecified chronic kidney disease: Secondary | ICD-10-CM | POA: Insufficient documentation

## 2014-08-31 DIAGNOSIS — J9610 Chronic respiratory failure, unspecified whether with hypoxia or hypercapnia: Secondary | ICD-10-CM | POA: Diagnosis not present

## 2014-08-31 DIAGNOSIS — Z882 Allergy status to sulfonamides status: Secondary | ICD-10-CM | POA: Diagnosis not present

## 2014-08-31 DIAGNOSIS — Z9981 Dependence on supplemental oxygen: Secondary | ICD-10-CM | POA: Diagnosis not present

## 2014-08-31 DIAGNOSIS — G4733 Obstructive sleep apnea (adult) (pediatric): Secondary | ICD-10-CM | POA: Diagnosis not present

## 2014-08-31 DIAGNOSIS — R9431 Abnormal electrocardiogram [ECG] [EKG]: Secondary | ICD-10-CM | POA: Insufficient documentation

## 2014-08-31 DIAGNOSIS — R918 Other nonspecific abnormal finding of lung field: Secondary | ICD-10-CM | POA: Insufficient documentation

## 2014-08-31 DIAGNOSIS — F329 Major depressive disorder, single episode, unspecified: Secondary | ICD-10-CM | POA: Diagnosis not present

## 2014-08-31 DIAGNOSIS — Z8673 Personal history of transient ischemic attack (TIA), and cerebral infarction without residual deficits: Secondary | ICD-10-CM | POA: Diagnosis not present

## 2014-08-31 DIAGNOSIS — L8995 Pressure ulcer of unspecified site, unstageable: Secondary | ICD-10-CM | POA: Diagnosis not present

## 2014-08-31 DIAGNOSIS — Z951 Presence of aortocoronary bypass graft: Secondary | ICD-10-CM | POA: Insufficient documentation

## 2014-08-31 DIAGNOSIS — E1122 Type 2 diabetes mellitus with diabetic chronic kidney disease: Secondary | ICD-10-CM | POA: Diagnosis not present

## 2014-08-31 DIAGNOSIS — I69351 Hemiplegia and hemiparesis following cerebral infarction affecting right dominant side: Secondary | ICD-10-CM | POA: Diagnosis not present

## 2014-08-31 DIAGNOSIS — R079 Chest pain, unspecified: Secondary | ICD-10-CM

## 2014-08-31 DIAGNOSIS — Z794 Long term (current) use of insulin: Secondary | ICD-10-CM | POA: Diagnosis not present

## 2014-08-31 DIAGNOSIS — J449 Chronic obstructive pulmonary disease, unspecified: Secondary | ICD-10-CM | POA: Insufficient documentation

## 2014-08-31 DIAGNOSIS — Z79899 Other long term (current) drug therapy: Secondary | ICD-10-CM | POA: Diagnosis not present

## 2014-08-31 DIAGNOSIS — I214 Non-ST elevation (NSTEMI) myocardial infarction: Secondary | ICD-10-CM | POA: Diagnosis present

## 2014-08-31 DIAGNOSIS — Z87891 Personal history of nicotine dependence: Secondary | ICD-10-CM | POA: Diagnosis not present

## 2014-08-31 DIAGNOSIS — I252 Old myocardial infarction: Secondary | ICD-10-CM | POA: Diagnosis not present

## 2014-08-31 HISTORY — DX: Type 2 diabetes mellitus without complications: E11.9

## 2014-08-31 HISTORY — DX: Heart failure, unspecified: I50.9

## 2014-08-31 HISTORY — DX: Essential (primary) hypertension: I10

## 2014-08-31 HISTORY — DX: Acute and chronic respiratory failure, unspecified whether with hypoxia or hypercapnia: J96.20

## 2014-08-31 HISTORY — DX: Chronic obstructive pulmonary disease, unspecified: J44.9

## 2014-08-31 HISTORY — DX: Cerebral infarction, unspecified: I63.9

## 2014-08-31 HISTORY — DX: Anxiety disorder, unspecified: F41.9

## 2014-08-31 HISTORY — DX: Chronic kidney disease, unspecified: N18.9

## 2014-08-31 HISTORY — DX: Atherosclerotic heart disease of native coronary artery without angina pectoris: I25.10

## 2014-08-31 LAB — COMPREHENSIVE METABOLIC PANEL
ALK PHOS: 71 U/L (ref 38–126)
ALT: 22 U/L (ref 14–54)
AST: 22 U/L (ref 15–41)
Albumin: 3.4 g/dL — ABNORMAL LOW (ref 3.5–5.0)
Anion gap: 13 (ref 5–15)
BILIRUBIN TOTAL: 0.2 mg/dL — AB (ref 0.3–1.2)
BUN: 41 mg/dL — ABNORMAL HIGH (ref 6–20)
CHLORIDE: 95 mmol/L — AB (ref 101–111)
CO2: 28 mmol/L (ref 22–32)
Calcium: 8.9 mg/dL (ref 8.9–10.3)
Creatinine, Ser: 1.8 mg/dL — ABNORMAL HIGH (ref 0.44–1.00)
GFR calc Af Amer: 32 mL/min — ABNORMAL LOW (ref >60)
GFR calc non Af Amer: 28 mL/min — ABNORMAL LOW (ref >60)
GLUCOSE: 301 mg/dL — AB (ref 65–99)
POTASSIUM: 4.2 mmol/L (ref 3.5–5.1)
Sodium: 136 mmol/L (ref 135–145)
Total Protein: 7.1 g/dL (ref 6.5–8.1)

## 2014-08-31 LAB — CBC
HEMATOCRIT: 38.7 % (ref 35.0–47.0)
Hemoglobin: 12.9 g/dL (ref 12.0–16.0)
MCH: 29.5 pg (ref 26.0–34.0)
MCHC: 33.2 g/dL (ref 32.0–36.0)
MCV: 88.8 fL (ref 80.0–100.0)
PLATELETS: 239 10*3/uL (ref 150–440)
RBC: 4.36 MIL/uL (ref 3.80–5.20)
RDW: 13.6 % (ref 11.5–14.5)
WBC: 8.4 10*3/uL (ref 3.6–11.0)

## 2014-08-31 LAB — TROPONIN I
Troponin I: 0.07 ng/mL — ABNORMAL HIGH (ref <0.031)
Troponin I: 0.07 ng/mL — ABNORMAL HIGH (ref <0.031)

## 2014-08-31 LAB — GLUCOSE, CAPILLARY: Glucose-Capillary: 236 mg/dL — ABNORMAL HIGH (ref 65–99)

## 2014-08-31 MED ORDER — LEVOTHYROXINE SODIUM 75 MCG PO TABS
75.0000 ug | ORAL_TABLET | Freq: Every day | ORAL | Status: DC
  Administered 2014-09-01: 75 ug via ORAL
  Filled 2014-08-31: qty 1

## 2014-08-31 MED ORDER — FUROSEMIDE 40 MG PO TABS
80.0000 mg | ORAL_TABLET | Freq: Two times a day (BID) | ORAL | Status: DC
  Administered 2014-09-01: 80 mg via ORAL
  Filled 2014-08-31: qty 2

## 2014-08-31 MED ORDER — ISOSORBIDE MONONITRATE ER 30 MG PO TB24
30.0000 mg | ORAL_TABLET | Freq: Every day | ORAL | Status: DC
  Administered 2014-08-31 – 2014-09-01 (×2): 30 mg via ORAL
  Filled 2014-08-31 (×2): qty 1

## 2014-08-31 MED ORDER — SENNOSIDES 8.6 MG PO TABS
1.0000 | ORAL_TABLET | Freq: Two times a day (BID) | ORAL | Status: DC
Start: 2014-08-31 — End: 2014-09-01
  Filled 2014-08-31 (×3): qty 1

## 2014-08-31 MED ORDER — POTASSIUM CHLORIDE ER 10 MEQ PO TBCR
10.0000 meq | EXTENDED_RELEASE_TABLET | Freq: Every day | ORAL | Status: DC
  Administered 2014-08-31 – 2014-09-01 (×2): 10 meq via ORAL
  Filled 2014-08-31 (×4): qty 1

## 2014-08-31 MED ORDER — NITROGLYCERIN 0.4 MG SL SUBL: 0.4000 mg | SUBLINGUAL_TABLET | SUBLINGUAL | Status: DC | PRN

## 2014-08-31 MED ORDER — VENLAFAXINE HCL ER 75 MG PO CP24
150.0000 mg | ORAL_CAPSULE | Freq: Every day | ORAL | Status: DC
  Administered 2014-08-31: 150 mg via ORAL
  Filled 2014-08-31: qty 2

## 2014-08-31 MED ORDER — POLYETHYLENE GLYCOL 3350 17 G PO PACK
17.0000 g | PACK | Freq: Every day | ORAL | Status: DC
  Administered 2014-08-31: 17 g via ORAL
  Filled 2014-08-31: qty 1

## 2014-08-31 MED ORDER — AMLODIPINE BESYLATE 5 MG PO TABS
5.0000 mg | ORAL_TABLET | Freq: Every day | ORAL | Status: DC
  Administered 2014-08-31 – 2014-09-01 (×2): 5 mg via ORAL
  Filled 2014-08-31 (×2): qty 1

## 2014-08-31 MED ORDER — TIOTROPIUM BROMIDE MONOHYDRATE 18 MCG IN CAPS
18.0000 ug | ORAL_CAPSULE | Freq: Every day | RESPIRATORY_TRACT | Status: DC
  Administered 2014-09-01: 18 ug via RESPIRATORY_TRACT
  Filled 2014-08-31 (×2): qty 5

## 2014-08-31 MED ORDER — INSULIN GLARGINE 100 UNIT/ML ~~LOC~~ SOLN
15.0000 [IU] | Freq: Two times a day (BID) | SUBCUTANEOUS | Status: DC
  Administered 2014-08-31 – 2014-09-01 (×2): 15 [IU] via SUBCUTANEOUS
  Filled 2014-08-31 (×4): qty 0.15

## 2014-08-31 MED ORDER — ALPRAZOLAM 0.25 MG PO TABS: 0.2500 mg | ORAL_TABLET | Freq: Two times a day (BID) | ORAL | Status: DC | PRN

## 2014-08-31 MED ORDER — BUPROPION HCL ER (XL) 150 MG PO TB24
150.0000 mg | ORAL_TABLET | Freq: Every day | ORAL | Status: DC
  Administered 2014-09-01: 150 mg via ORAL
  Filled 2014-08-31: qty 1

## 2014-08-31 MED ORDER — ASPIRIN EC 325 MG PO TBEC
325.0000 mg | DELAYED_RELEASE_TABLET | Freq: Every day | ORAL | Status: DC
  Administered 2014-09-01: 325 mg via ORAL
  Filled 2014-08-31: qty 1

## 2014-08-31 MED ORDER — HEPARIN SODIUM (PORCINE) 5000 UNIT/ML IJ SOLN
5000.0000 [IU] | Freq: Three times a day (TID) | INTRAMUSCULAR | Status: DC
  Administered 2014-08-31 – 2014-09-01 (×3): 5000 [IU] via SUBCUTANEOUS
  Filled 2014-08-31 (×3): qty 1

## 2014-08-31 MED ORDER — BISACODYL 10 MG RE SUPP
10.0000 mg | RECTAL | Status: DC | PRN
Start: 2014-08-31 — End: 2014-09-01

## 2014-08-31 MED ORDER — NITROGLYCERIN 0.4 MG SL SUBL
0.4000 mg | SUBLINGUAL_TABLET | SUBLINGUAL | Status: DC | PRN
  Administered 2014-08-31: 0.4 mg via SUBLINGUAL
  Filled 2014-08-31: qty 1

## 2014-08-31 MED ORDER — HYDROCODONE-ACETAMINOPHEN 5-325 MG PO TABS
1.0000 | ORAL_TABLET | ORAL | Status: DC | PRN
  Administered 2014-09-01 (×2): 1 via ORAL
  Filled 2014-08-31 (×2): qty 1

## 2014-08-31 MED ORDER — FENTANYL 12 MCG/HR TD PT72
12.5000 ug | MEDICATED_PATCH | TRANSDERMAL | Status: DC
  Administered 2014-08-31: 12.5 ug via TRANSDERMAL
  Filled 2014-08-31: qty 1

## 2014-08-31 MED ORDER — TRAZODONE HCL 50 MG PO TABS
25.0000 mg | ORAL_TABLET | Freq: Every evening | ORAL | Status: DC | PRN
  Administered 2014-08-31: 25 mg via ORAL
  Filled 2014-08-31: qty 1

## 2014-08-31 MED ORDER — ENALAPRIL MALEATE 5 MG PO TABS
2.5000 mg | ORAL_TABLET | Freq: Every day | ORAL | Status: DC
  Administered 2014-08-31 – 2014-09-01 (×2): 2.5 mg via ORAL
  Filled 2014-08-31 (×2): qty 1

## 2014-08-31 MED ORDER — DIVALPROEX SODIUM 500 MG PO DR TAB
500.0000 mg | DELAYED_RELEASE_TABLET | Freq: Two times a day (BID) | ORAL | Status: DC
  Administered 2014-08-31 – 2014-09-01 (×2): 500 mg via ORAL
  Filled 2014-08-31 (×2): qty 1

## 2014-08-31 MED ORDER — INSULIN ASPART 100 UNIT/ML ~~LOC~~ SOLN
0.0000 [IU] | Freq: Three times a day (TID) | SUBCUTANEOUS | Status: DC
  Administered 2014-09-01: 2 [IU] via SUBCUTANEOUS
  Administered 2014-09-01: 3 [IU] via SUBCUTANEOUS
  Filled 2014-08-31: qty 3
  Filled 2014-08-31: qty 2

## 2014-08-31 MED ORDER — THIOTHIXENE 2 MG PO CAPS
2.0000 mg | ORAL_CAPSULE | Freq: Two times a day (BID) | ORAL | Status: DC
  Administered 2014-08-31 – 2014-09-01 (×2): 2 mg via ORAL
  Filled 2014-08-31 (×3): qty 1

## 2014-08-31 MED ORDER — METOPROLOL SUCCINATE ER 50 MG PO TB24
50.0000 mg | ORAL_TABLET | Freq: Every day | ORAL | Status: DC
  Administered 2014-08-31: 50 mg via ORAL
  Filled 2014-08-31 (×2): qty 1

## 2014-08-31 MED ORDER — ACETAMINOPHEN 325 MG PO TABS
650.0000 mg | ORAL_TABLET | ORAL | Status: DC | PRN
Start: 2014-08-31 — End: 2014-09-01

## 2014-08-31 NOTE — ED Notes (Signed)
Per previous nurse, pt only willing to take one nitro

## 2014-08-31 NOTE — Telephone Encounter (Signed)
error 

## 2014-08-31 NOTE — ED Provider Notes (Signed)
[facility] Emergency Department Provider Note  ____________________________________________  Time seen: Approximately 5:15 PM  I have reviewed the triage vital signs and the nursing notes.   HISTORY  Chief Complaint Chest Pain    HPI Susan Graham is a 69 y.o. female with history of CVA with right sided deficits, coronary artery disease status post CABG who presents for evaluation of gradual onset substernal chest pain, sharp, intermittent since 3 PM. Pain was associated with shortness of breath. It began while she was walking to supper. It has improved significantly. She received aspirin via EMS. No nausea, no diaphoresis. she reports this feels similar to her prior myocardial infarction. No modifying factors. No cough, sneezing, runny nose, congestion, vomiting, diarrhea, fevers or chills.   Past Medical History  Diagnosis Date  . Acute on chronic respiratory failure   . CHF (congestive heart failure)   . Hypertension   . Anxiety   . COPD (chronic obstructive pulmonary disease)   . Diabetes mellitus without complication   . Coronary artery disease   . Stroke     Patient Active Problem List   Diagnosis Date Noted  . Angina at rest 08/31/2014    Past Surgical History  Procedure Laterality Date  . Coronary artery bypass graft      Current Outpatient Rx  Name  Route  Sig  Dispense  Refill  . acetaminophen (TYLENOL) 325 MG tablet   Oral   Take 650 mg by mouth every 4 (four) hours as needed for mild pain or fever.         Marland Kitchen acetaminophen (TYLENOL) 500 MG tablet   Oral   Take 500 mg by mouth daily.         Marland Kitchen ALPRAZolam (XANAX) 0.25 MG tablet   Oral   Take 0.25 mg by mouth 2 (two) times daily as needed for anxiety.         Marland Kitchen amLODipine (NORVASC) 5 MG tablet   Oral   Take 5 mg by mouth daily.         . ARTIFICIAL TEAR OP   Ophthalmic   Apply 1 drop to eye 4 (four) times daily as needed (for dry eyes).         Marland Kitchen aspirin EC 325  MG tablet   Oral   Take 325 mg by mouth daily.         . bisacodyl (DULCOLAX) 10 MG suppository   Rectal   Place 10 mg rectally as needed for moderate constipation.         Marland Kitchen buPROPion (WELLBUTRIN XL) 150 MG 24 hr tablet   Oral   Take 150 mg by mouth daily.         . Diphenhyd-Hydrocort-Nystatin (FIRST-DUKES MOUTHWASH) SUSP   Oral   Take 5 mLs by mouth 3 (three) times daily as needed (for sore throat). Pt swishes and spits.         . divalproex (DEPAKOTE) 500 MG DR tablet   Oral   Take 500 mg by mouth 2 (two) times daily.         . enalapril (VASOTEC) 2.5 MG tablet   Oral   Take 2.5 mg by mouth daily.         . fentaNYL (DURAGESIC - DOSED MCG/HR) 12 MCG/HR   Transdermal   Place 12.5 mcg onto the skin every 3 (three) days.         . furosemide (LASIX) 80 MG tablet   Oral  Take 80 mg by mouth 2 (two) times daily.         Marland Kitchen HYDROcodone-acetaminophen (NORCO/VICODIN) 5-325 MG per tablet   Oral   Take 1 tablet by mouth every 4 (four) hours as needed for moderate pain.          Marland Kitchen insulin glargine (LANTUS) 100 UNIT/ML injection   Subcutaneous   Inject 15-60 Units into the skin 2 (two) times daily. Pt uses 15 units in the morning and 60 units every evening.         . isosorbide mononitrate (IMDUR) 30 MG 24 hr tablet   Oral   Take 30 mg by mouth daily.         Marland Kitchen levothyroxine (SYNTHROID, LEVOTHROID) 75 MCG tablet   Oral   Take 75 mcg by mouth daily before breakfast.         . metoprolol succinate (TOPROL-XL) 50 MG 24 hr tablet   Oral   Take 50 mg by mouth daily.         . polyethylene glycol (MIRALAX / GLYCOLAX) packet   Oral   Take 17 g by mouth at bedtime.         . potassium chloride (K-DUR) 10 MEQ tablet   Oral   Take 10 mEq by mouth daily.         Marland Kitchen senna (SENOKOT) 8.6 MG tablet   Oral   Take 1 tablet by mouth 2 (two) times daily.         Marland Kitchen thiothixene (NAVANE) 2 MG capsule   Oral   Take 2 mg by mouth 2 (two) times daily.          Marland Kitchen tiotropium (SPIRIVA) 18 MCG inhalation capsule   Inhalation   Place 18 mcg into inhaler and inhale daily.         . traZODone (DESYREL) 50 MG tablet   Oral   Take 25 mg by mouth at bedtime as needed for sleep.         Marland Kitchen venlafaxine XR (EFFEXOR-XR) 150 MG 24 hr capsule   Oral   Take 150 mg by mouth at bedtime.           Allergies Codeine; Levaquin; Risperdal; and Sulfa antibiotics  History reviewed. No pertinent family history.  Social History History  Substance Use Topics  . Smoking status: Former Research scientist (life sciences)  . Smokeless tobacco: Not on file  . Alcohol Use: No    Review of Systems Constitutional: No fever/chills Eyes: No visual changes. ENT: No sore throat. Cardiovascular: +chest pain. Respiratory: +shortness of breath. Gastrointestinal: No abdominal pain.  No nausea, no vomiting.  No diarrhea.  No constipation. Genitourinary: Negative for dysuria. Musculoskeletal: Negative for back pain. Skin: Negative for rash. Neurological: Negative for headaches, focal weakness or numbness.  10-point ROS otherwise negative.  ____________________________________________   PHYSICAL EXAM:  VITAL SIGNS: ED Triage Vitals  Enc Vitals Group     BP 08/31/14 1658 174/54 mmHg     Pulse Rate 08/31/14 1658 59     Resp --      Temp 08/31/14 1658 98.5 F (36.9 C)     Temp Source 08/31/14 1658 Oral     SpO2 08/31/14 1658 99 %     Weight 08/31/14 1658 213 lb (96.616 kg)     Height 08/31/14 1658 5\' 5"  (1.651 m)     Head Cir --      Peak Flow --      Pain Score 08/31/14 1702 8  Pain Loc --      Pain Edu? --      Excl. in [place]? --     Constitutional: Alert and oriented. Well appearing and in no acute distress. Eyes: Conjunctivae are normal. PERRL. EOMI. Head: Atraumatic. Nose: No congestion/rhinnorhea. Mouth/Throat: Mucous membranes are moist.  Oropharynx non-erythematous. Neck: No stridor.  Cardiovascular: mildly bradycardic rate, regular rhythm. Grossly normal  heart sounds.  Good peripheral circulation. Respiratory: Normal respiratory effort.  No retractions. Lungs CTAB. Gastrointestinal: Soft and nontender. No distention. No abdominal bruits. No CVA tenderness. Genitourinary: deferred Musculoskeletal: No lower extremity tenderness nor edema.  No joint effusions. Neurologic:  Normal speech and language. No gross focal neurologic deficits are appreciated. Skin:  Skin is warm, dry and intact. No rash noted. Psychiatric: Mood and affect are normal. Speech and behavior are normal.  ____________________________________________   LABS (all labs ordered are listed, but only abnormal results are displayed)  Labs Reviewed  COMPREHENSIVE METABOLIC PANEL - Abnormal; Notable for the following:    Chloride 95 (*)    Glucose, Bld 301 (*)    BUN 41 (*)    Creatinine, Ser 1.80 (*)    Albumin 3.4 (*)    Total Bilirubin 0.2 (*)    GFR calc non Af Amer 28 (*)    GFR calc Af Amer 32 (*)    All other components within normal limits  TROPONIN I - Abnormal; Notable for the following:    Troponin I 0.07 (*)    All other components within normal limits  CBC   ____________________________________________  EKG  ED ECG REPORT I, Joanne Gavel, the attending physician, personally viewed and interpreted this ECG.   Date: 08/31/2014  EKG Time: 16:52  Rate: 58  Rhythm: sinus bradycardia  Axis: left  Intervals:none  ST&T Change: No acute ST segment elevation. T-wave inversions in lead aVL. LVH with QRS widening/repolarization abnormality  ____________________________________________  RADIOLOGY  CXR  IMPRESSION: Stable chronic findings post CABG. No acute cardiopulmonary process identified.   ____________________________________________   PROCEDURES  Procedure(s) performed: None  Critical Care performed: Yes. Total critic care time spent 30 minutes.  ____________________________________________   INITIAL IMPRESSION / ASSESSMENT AND PLAN  / ED COURSE  Pertinent labs & imaging results that were available during my care of the patient were reviewed by me and considered in my medical decision making (see chart for details).  Susan Graham is a 69 y.o. female with history of CVA with right sided deficits, coronary artery disease status post CABG who presents for evaluation of gradual onset substernal chest pain, sharp, intermittent since 3 PM. Patient with significant improvement of her pain with aspirin but will give SLnitroglycerin as well as morphine with an attempt to make her chest pain-free. For ongoing pain despite the above measures, we'll consider heparin drip. Labs notable for troponin elevation 0.07 concerning for NSTEMI. Creatinine 1.80 but this appears to be her baseline. Not consistent with acute aortic dissection or pulmonary embolism. Case discussed with hospitalist for admission at this time. ____________________________________________   FINAL CLINICAL IMPRESSION(S) / ED DIAGNOSES  Final diagnoses:  NSTEMI (non-ST elevated myocardial infarction)  Chest pain, unspecified chest pain type      Joanne Gavel, MD 08/31/14 [phone]

## 2014-08-31 NOTE — ED Notes (Signed)
Ems from [facility] for chest pain and left facial droop. Facial droop not evident. Still c/o chest pain 8/10 midchest. Hx cabg maybe 6 years ago.

## 2014-08-31 NOTE — H&P (Signed)
[place] [facility] [place] NAME: Susan Graham    MR#:  [id number]  DATE OF BIRTH:  1945/05/10  DATE OF ADMISSION:  08/31/2014  PRIMARY CARE PHYSICIAN: Arnette Norris, MD   REQUESTING/REFERRING PHYSICIAN: Darrick Penna  CHIEF COMPLAINT:   Chief Complaint  Patient presents with  . Chest Pain    HISTORY OF PRESENT ILLNESS: Susan Graham  is a 69 y.o. female with a known history of acute on chronic respiratory failure and oxygen use at home, congestive heart failure diastolic, hypertension, COPD, diabetes with chronic insulin use, coronary artery disease status post CABG, stroke with residual right-sided hemiplegia, chronic kidney disease who lives in a nursing home since she had a stroke 8 years ago. Today she had severe chest pain in center of her chest which was pressure-like 8-10 out of 10, which lasted for a few seconds to minutes and she reported it to the nursing home*and because of her cardiac history dates called EMS they gave her 4 aspirin tablets and brought her to emergency room. Her troponin is negative but because of her cardiac history yes physician decided to admit her for more observation.  PAST MEDICAL HISTORY:   Past Medical History  Diagnosis Date  . Acute on chronic respiratory failure   . CHF (congestive heart failure)   . Hypertension   . Anxiety   . COPD (chronic obstructive pulmonary disease)   . Diabetes mellitus without complication   . Coronary artery disease   . Stroke   . Chronic kidney disease     PAST SURGICAL HISTORY:  Past Surgical History  Procedure Laterality Date  . Coronary artery bypass graft      SOCIAL HISTORY:  History  Substance Use Topics  . Smoking status: Former Research scientist (life sciences)  . Smokeless tobacco: Not on file  . Alcohol Use: No    FAMILY HISTORY:  Family History  Problem Relation Age of Onset  . Heart attack Father   . Diabetes Father   . Diabetes Brother     DRUG ALLERGIES:  Allergies   Allergen Reactions  . Codeine Other (See Comments)    Reaction:  Unknown   . Levaquin [Levofloxacin In D5w] Other (See Comments)    Reaction:  Unknown   . Risperdal [Risperidone] Other (See Comments)    Reaction:  Unknown   . Sulfa Antibiotics Other (See Comments)    Reaction:  Unknown     REVIEW OF SYSTEMS:   CONSTITUTIONAL: No fever, fatigue or weakness.  EYES: No blurred or double vision.  EARS, NOSE, AND THROAT: No tinnitus or ear pain.  RESPIRATORY: No cough, shortness of breath, wheezing or hemoptysis.  CARDIOVASCULAR: positive for chest pain, no orthopnea, edema.  GASTROINTESTINAL: No nausea, vomiting, diarrhea or abdominal pain.  GENITOURINARY: No dysuria, hematuria.  ENDOCRINE: No polyuria, nocturia,  HEMATOLOGY: No anemia, easy bruising or bleeding SKIN: No rash or lesion. MUSCULOSKELETAL: No joint pain or arthritis.   NEUROLOGIC: No tingling, numbness, weakness.  PSYCHIATRY: No anxiety or depression.   MEDICATIONS AT HOME:  Prior to Admission medications   Medication Sig Start Date End Date Taking? Authorizing Provider  acetaminophen (TYLENOL) 325 MG tablet Take 650 mg by mouth every 4 (four) hours as needed for mild pain or fever.   Yes Historical Provider, MD  acetaminophen (TYLENOL) 500 MG tablet Take 500 mg by mouth daily.   Yes Historical Provider, MD  ALPRAZolam (XANAX) 0.25 MG tablet Take 0.25 mg by mouth 2 (two) times  daily as needed for anxiety.   Yes Historical Provider, MD  amLODipine (NORVASC) 5 MG tablet Take 5 mg by mouth daily.   Yes Historical Provider, MD  ARTIFICIAL TEAR OP Apply 1 drop to eye 4 (four) times daily as needed (for dry eyes).   Yes Historical Provider, MD  aspirin EC 325 MG tablet Take 325 mg by mouth daily.   Yes Historical Provider, MD  bisacodyl (DULCOLAX) 10 MG suppository Place 10 mg rectally as needed for moderate constipation.   Yes Historical Provider, MD  buPROPion (WELLBUTRIN XL) 150 MG 24 hr tablet Take 150 mg by mouth  daily.   Yes Historical Provider, MD  Diphenhyd-Hydrocort-Nystatin (FIRST-DUKES MOUTHWASH) SUSP Take 5 mLs by mouth 3 (three) times daily as needed (for sore throat). Pt swishes and spits.   Yes Historical Provider, MD  divalproex (DEPAKOTE) 500 MG DR tablet Take 500 mg by mouth 2 (two) times daily.   Yes Historical Provider, MD  enalapril (VASOTEC) 2.5 MG tablet Take 2.5 mg by mouth daily.   Yes Historical Provider, MD  fentaNYL (DURAGESIC - DOSED MCG/HR) 12 MCG/HR Place 12.5 mcg onto the skin every 3 (three) days.   Yes Historical Provider, MD  furosemide (LASIX) 80 MG tablet Take 80 mg by mouth 2 (two) times daily.   Yes Historical Provider, MD  HYDROcodone-acetaminophen (NORCO/VICODIN) 5-325 MG per tablet Take 1 tablet by mouth every 4 (four) hours as needed for moderate pain.    Yes Historical Provider, MD  insulin glargine (LANTUS) 100 UNIT/ML injection Inject 15-60 Units into the skin 2 (two) times daily. Pt uses 15 units in the morning and 60 units every evening.   Yes Historical Provider, MD  isosorbide mononitrate (IMDUR) 30 MG 24 hr tablet Take 30 mg by mouth daily.   Yes Historical Provider, MD  levothyroxine (SYNTHROID, LEVOTHROID) 75 MCG tablet Take 75 mcg by mouth daily before breakfast.   Yes Historical Provider, MD  metoprolol succinate (TOPROL-XL) 50 MG 24 hr tablet Take 50 mg by mouth daily.   Yes Historical Provider, MD  polyethylene glycol (MIRALAX / GLYCOLAX) packet Take 17 g by mouth at bedtime.   Yes Historical Provider, MD  potassium chloride (K-DUR) 10 MEQ tablet Take 10 mEq by mouth daily.   Yes Historical Provider, MD  senna (SENOKOT) 8.6 MG tablet Take 1 tablet by mouth 2 (two) times daily.   Yes Historical Provider, MD  thiothixene (NAVANE) 2 MG capsule Take 2 mg by mouth 2 (two) times daily.   Yes Historical Provider, MD  tiotropium (SPIRIVA) 18 MCG inhalation capsule Place 18 mcg into inhaler and inhale daily.   Yes Historical Provider, MD  traZODone (DESYREL) 50 MG  tablet Take 25 mg by mouth at bedtime as needed for sleep.   Yes Historical Provider, MD  venlafaxine XR (EFFEXOR-XR) 150 MG 24 hr capsule Take 150 mg by mouth at bedtime.   Yes Historical Provider, MD      PHYSICAL EXAMINATION:   VITAL SIGNS: Blood pressure 134/60, pulse 56, temperature 98.5 F (36.9 C), temperature source Oral, resp. rate 17, height 5\' 5"  (1.651 m), weight 96.616 kg (213 lb), SpO2 97 %.  GENERAL:  69 y.o.-year-old obase patient lying in the bed with no acute distress.  EYES: Pupils equal, round, reactive to light and accommodation. No scleral icterus. Extraocular muscles intact.  HEENT: Head atraumatic, normocephalic. Oropharynx and nasopharynx clear.  NECK:  Supple, no jugular venous distention. No thyroid enlargement, no tenderness.  LUNGS: Normal breath sounds  bilaterally, no wheezing, rales,rhonchi or crepitation. No use of accessory muscles of respiration.  CARDIOVASCULAR: S1, S2 normal. No murmurs, rubs, or gallops. Surgical scar present on chest/ ABDOMEN: Soft, nontender, nondistended. Bowel sounds present. No organomegaly or mass.  EXTREMITIES: No pedal edema, cyanosis, or clubbing.  NEUROLOGIC: Cranial nerves II through XII are intact. Power 3/5 on RUL, 4/5 LUL, 2/5 both LL. PSYCHIATRIC: The patient is alert and oriented x 3.  SKIN: No obvious rash, lesion, or ulcer.   LABORATORY PANEL:   CBC  Recent Labs Lab 08/31/14 1724  WBC 8.4  HGB 12.9  HCT 38.7  PLT 239  MCV 88.8  MCH 29.5  MCHC 33.2  RDW 13.6   ------------------------------------------------------------------------------------------------------------------  Chemistries   Recent Labs Lab 08/31/14 1724  NA 136  K 4.2  CL 95*  CO2 28  GLUCOSE 301*  BUN 41*  CREATININE 1.80*  CALCIUM 8.9  AST 22  ALT 22  ALKPHOS 71  BILITOT 0.2*   ------------------------------------------------------------------------------------------------------------------ estimated creatinine clearance  is 33.9 mL/min (by C-G formula based on Cr of 1.8). ------------------------------------------------------------------------------------------------------------------ No results for input(s): TSH, T4TOTAL, T3FREE, THYROIDAB in the last 72 hours.  Invalid input(s): FREET3   Cardiac Enzymes  Recent Labs Lab 08/31/14 1724  TROPONINI 0.07*   ------------------------------------------------------------------------------------------------------------------ Invalid input(s): POCBNP  ---------------------------------------------------------------------------------------------------------------  Urinalysis    Component Value Date/Time   COLORURINE Yellow 02/25/2013 1426   APPEARANCEUR Cloudy 02/25/2013 1426   LABSPEC 1.009 02/25/2013 1426   PHURINE 6.0 02/25/2013 1426   GLUCOSEU Negative 02/25/2013 1426   HGBUR Negative 02/25/2013 1426   BILIRUBINUR Negative 02/25/2013 1426   KETONESUR Negative 02/25/2013 1426   PROTEINUR 100 mg/dL 02/25/2013 1426   NITRITE Negative 02/25/2013 1426   LEUKOCYTESUR 3+ 02/25/2013 1426     RADIOLOGY: Dg Chest 2 View  08/31/2014   CLINICAL DATA:  Chest pain with left facial droop. In assisted living. Initial encounter.  EXAM: CHEST  2 VIEW  COMPARISON:  09/08/2012 radiographs.  FINDINGS: The heart size and mediastinal contours are stable status post median sternotomy and CABG. Chronic pleural thickening and parenchymal scarring at the left lung base are stable. There is stable mild right basilar atelectasis. No edema, confluent airspace opacity or significant pleural effusion identified. The bones appear unchanged.  IMPRESSION: Stable chronic findings post CABG. No acute cardiopulmonary process identified.   Electronically Signed   By: Richardean Sale M.D.   On: 08/31/2014 17:51    IMPRESSION AND PLAN:  * Angina Because of cardiac history we'll monitor on telemetry, follow serial troponin, continue her baseline cardiac medications. Cardiology consult  with her primary cardiologist Dr. Etta Quill  * Coronary artery disease Continue home medications.  * Diabetes Continue Lantus as she is taking at home. Started on insulin sliding scale coverage.  * Hypertension Continue amlodipine, Imdur, enalapril.  *COPD with chronic respiratory failure Currently no active wheezing, continue Spiriva.  * Hypothyroidism Continue levothyroxine.  All the records are reviewed and case discussed with ED provider. Management plans discussed with the patient, family and they are in agreement.  CODE STATUS: full Advance Directive Documentation        Most Recent Value   Type of Advance Directive  Out of facility DNR (pink MOST or yellow form)   Pre-existing out of facility DNR order (yellow form or pink MOST form)  Yellow form placed in chart (order not valid for inpatient use)   "MOST" Form in Place?         TOTAL TIME TAKING CARE OF  THIS PATIENT: 50 minutes.    Vaughan Basta M.D on 08/31/2014   Between 7am to 6pm - Pager - [phone]  After 6pm go to [email or web address] - password [email or web address] [email or web address]  Office  [phone]  CC: Primary care physician; Arnette Norris, MD

## 2014-09-01 DIAGNOSIS — L8990 Pressure ulcer of unspecified site, unspecified stage: Secondary | ICD-10-CM | POA: Insufficient documentation

## 2014-09-01 DIAGNOSIS — I25119 Atherosclerotic heart disease of native coronary artery with unspecified angina pectoris: Secondary | ICD-10-CM | POA: Diagnosis not present

## 2014-09-01 LAB — LIPID PANEL
CHOL/HDL RATIO: 8.2 ratio
CHOLESTEROL: 238 mg/dL — AB (ref 0–200)
HDL: 29 mg/dL — ABNORMAL LOW (ref >40)
LDL Cholesterol: 145 mg/dL — ABNORMAL HIGH (ref 0–99)
Triglycerides: 321 mg/dL — ABNORMAL HIGH (ref <150)
VLDL: 64 mg/dL — ABNORMAL HIGH (ref 0–40)

## 2014-09-01 LAB — CBC
HCT: 33.4 % — ABNORMAL LOW (ref 35.0–47.0)
Hemoglobin: 11.3 g/dL — ABNORMAL LOW (ref 12.0–16.0)
MCH: 29.9 pg (ref 26.0–34.0)
MCHC: 33.8 g/dL (ref 32.0–36.0)
MCV: 88.2 fL (ref 80.0–100.0)
Platelets: 191 10*3/uL (ref 150–440)
RBC: 3.78 MIL/uL — AB (ref 3.80–5.20)
RDW: 13.3 % (ref 11.5–14.5)
WBC: 8.4 10*3/uL (ref 3.6–11.0)

## 2014-09-01 LAB — BASIC METABOLIC PANEL
Anion gap: 10 (ref 5–15)
BUN: 41 mg/dL — ABNORMAL HIGH (ref 6–20)
CO2: 30 mmol/L (ref 22–32)
Calcium: 8.5 mg/dL — ABNORMAL LOW (ref 8.9–10.3)
Chloride: 100 mmol/L — ABNORMAL LOW (ref 101–111)
Creatinine, Ser: 1.84 mg/dL — ABNORMAL HIGH (ref 0.44–1.00)
GFR calc Af Amer: 31 mL/min — ABNORMAL LOW (ref >60)
GFR calc non Af Amer: 27 mL/min — ABNORMAL LOW (ref >60)
GLUCOSE: 199 mg/dL — AB (ref 65–99)
POTASSIUM: 4.1 mmol/L (ref 3.5–5.1)
Sodium: 140 mmol/L (ref 135–145)

## 2014-09-01 LAB — GLUCOSE, CAPILLARY
GLUCOSE-CAPILLARY: 158 mg/dL — AB (ref 65–99)
GLUCOSE-CAPILLARY: 207 mg/dL — AB (ref 65–99)

## 2014-09-01 LAB — MRSA PCR SCREENING: MRSA BY PCR: NEGATIVE

## 2014-09-01 LAB — TROPONIN I
Troponin I: 0.07 ng/mL — ABNORMAL HIGH (ref <0.031)
Troponin I: 0.07 ng/mL — ABNORMAL HIGH (ref <0.031)

## 2014-09-01 MED ORDER — ATORVASTATIN CALCIUM 40 MG PO TABS: 40.0000 mg | ORAL_TABLET | Freq: Every day | ORAL | Status: AC

## 2014-09-01 MED ORDER — ATORVASTATIN CALCIUM 20 MG PO TABS: 40.0000 mg | ORAL_TABLET | Freq: Every day | ORAL | Status: DC

## 2014-09-01 NOTE — Clinical Social Work Note (Signed)
Clinical Social Work Assessment  Patient Details  Name: Susan Graham MRN: [id number] Date of Birth: 1945/07/09  Date of referral:  09/01/14               Reason for consult:  Facility Placement, Other (Comment Required) (From Facility [facility]) )                Permission sought to share information with:  Chartered certified accountant granted to share information::  Yes, Verbal Permission Granted  Name::      Retail buyer::   [place]   Relationship::     Contact Information:     Housing/Transportation Living arrangements for the past 2 months:  [place] of Information:  Patient, Other (Comment Required) (Sister Susan Graham ) Patient Interpreter Needed:  None Criminal Activity/Legal Involvement Pertinent to Current Situation/Hospitalization:  No - Comment as needed Significant Relationships:  Adult Children, Siblings, Significant Other Lives with:  Facility Resident Do you feel safe going back to the place where you live?  Yes Need for family participation in patient care:  Yes (Comment)  Care giving concerns: Patient is a long term care resident at [facility].    Social Worker assessment / plan: Holiday representative ([place]) received consult that patient is from [facility] and ready to return. Patient came into hospital on 08/31/14 and is ready for D/C on 09/01/14. CSW met with patient and her boyfriend was at bedside. Patient was laying in the bed wearing oxygen. Patient reported that she is from [facility] and has been there for 3 years now. Patient reported that she wears oxygen PRN at [facility]. Patient is agreeable to return to [facility] today. CSW attempted to contact patient's daughter but the home phone number was disconnected and the cell phone number did not have a voicemail set up. CSW contacted patient's sister Susan Graham and made her aware of patient's D/C. Sister is agreeable for patient to return to [facility]  today.   Employment status:  Disabled (Comment on whether or not currently receiving Disability), Retired Forensic scientist:  Medicare, Medicaid In [place] PT Recommendations:  Not assessed at this time Information / Referral to community resources:  [place]  Patient/Family's Response to care: Patient is agreeable to return to [facility] today.   Patient/Family's Understanding of and Emotional Response to Diagnosis, Current Treatment, and Prognosis:  Patient was pleasant throughout assessment.   Emotional Assessment Appearance:  Appears stated age Attitude/Demeanor/Rapport:    Affect (typically observed):  Accepting, Pleasant Orientation:  Oriented to Self, Oriented to Place, Oriented to  Time, Oriented to Situation Alcohol / Substance use:  Not Applicable Psych involvement (Current and /or in the community):  No (Comment)  Discharge Needs  Concerns to be addressed:  Discharge Planning Concerns Readmission within the last 30 days:  No Current discharge risk:  None Barriers to Discharge:  No Barriers Identified   Susan Freshwater, LCSW 09/01/2014, 2:34 PM

## 2014-09-01 NOTE — Progress Notes (Signed)
2 L of oxygen. NSR. Pt reported chest pain and received oxy. Boyfriend at the bedside. FS are stabl.e  A& O. Able to feed herself. Takes meds ok. EMS to take to [facility]. Report called to [place] [facility] [place]. Pt has no further concerns at this time.

## 2014-09-01 NOTE — Progress Notes (Signed)
Admitted patient to room 240. Checked skin with Lattie Haw, Therapist, sports.

## 2014-09-01 NOTE — Progress Notes (Signed)
Patient is medically stable for D/C back to [facility] today. Per Madaline Savage RN at [facility] patient is going to room 205-A. RN will call report to Madaline Savage at [phone] and arrange EMS for transport. Clinical Education officer, museum (CSW) prepared D/C packet and faxed D/C Summary to [employer]. Patient is aware of above. Patient's boyfriend was at bedside. CSW contacted patient's sister [name] and made her aware of above. Susie gave CSW another number for patient's daughter Tammy [phone]. CSW contacted daughter Lynelle Smoke and made her aware of above. Please reconsult if future social work needs arise. CSW signing off.   Blima Rich, [place] [phone]

## 2014-09-01 NOTE — Care Management Note (Signed)
Case Management Note  Patient Details  Name: ALAISHA EVERSLEY MRN: [id number] Date of Birth: [birth year]  Subjective/Objective:   Medicare observation letter delivered to patient room.                  Action/Plan:   Expected Discharge Date:                  Expected Discharge Plan:     In-House Referral:     Discharge planning Services     Post Acute Care Choice:    Choice offered to:     DME Arranged:    DME Agency:     HH Arranged:    [place] Agency:     Status of Service:     Medicare Important Message Given:    Date Medicare IM Given:    Medicare IM give by:    Date Additional Medicare IM Given:    Additional Medicare Important Message give by:     If discussed at [place] of Stay Meetings, dates discussed:    Additional Comments:  Ival Bible, RN 09/01/2014, 6:39 PM

## 2014-09-01 NOTE — Consult Note (Signed)
Reason for Consult:Angina, chest pain Referring Physician: Dr Anselm Jungling  hospital  Susan Graham is an 69 y.o. female.  HPI:  69  Year old white female resident of nursing home history of multiple medical problems including coronary disease coronary bypass surgery abnormal EKG CVA chronic renal sufficiency COPD congestive heart failure anxiety depression who complained of recent chest pain symptoms was sent to the emergency for evaluation. Patient had low borderline troponins. The patient has known coronary disease still evaluation was recommended. Patient now states that pain has resolved she feels reasonably well still weak tired and short of breath. 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  Past Medical History  Diagnosis Date  . Acute on chronic respiratory failure   . CHF (congestive heart failure)   . Hypertension   . Anxiety   . COPD (chronic obstructive pulmonary disease)   . Diabetes mellitus without complication   . Coronary artery disease   . Stroke   . Chronic kidney disease     Past Surgical History  Procedure Laterality Date  . Coronary artery bypass graft      Family History  Problem Relation Age of Onset  . Heart attack Father   . Diabetes Father   . Diabetes Brother     Social History:  reports that she has quit smoking. She does not have any smokeless tobacco history on file. She reports that she does not drink alcohol or use illicit drugs.  Allergies:  Allergies  Allergen Reactions  . Codeine Other (See Comments)    Reaction:  Unknown   . Levaquin [Levofloxacin In D5w] Other (See Comments)    Reaction:  Unknown   . Risperdal [Risperidone] Other (See Comments)    Reaction:  Unknown   . Sulfa Antibiotics Other (See Comments)    Reaction:  Unknown     Medications: I have reviewed the patient's current medications.  Results for orders placed or performed during the hospital encounter of 08/31/14 (from the past 48 hour(s))   MRSA PCR Screening     Status: None   Collection Time: 08/31/14  4:29 PM  Result Value Ref Range   MRSA by PCR NEGATIVE NEGATIVE    Comment:        The GeneXpert MRSA Assay (FDA approved for NASAL specimens only), is one component of a comprehensive MRSA colonization surveillance program. It is not intended to diagnose MRSA infection nor to guide or monitor treatment for MRSA infections.   CBC     Status: None   Collection Time: 08/31/14  5:24 PM  Result Value Ref Range   WBC 8.4 3.6 - 11.0 K/uL   RBC 4.36 3.80 - 5.20 MIL/uL   Hemoglobin 12.9 12.0 - 16.0 g/dL   HCT 38.7 35.0 - 47.0 %   MCV 88.8 80.0 - 100.0 fL   MCH 29.5 26.0 - 34.0 pg   MCHC 33.2 32.0 - 36.0 g/dL   RDW 13.6 11.5 - 14.5 %   Platelets 239 150 - 440 K/uL  Comprehensive metabolic panel     Status: Abnormal   Collection Time: 08/31/14  5:24 PM  Result Value Ref Range   Sodium 136 135 - 145 mmol/L   Potassium 4.2 3.5 - 5.1 mmol/L   Chloride 95 (L) 101 - 111 mmol/L   CO2 28 22 - 32 mmol/L   Glucose, Bld 301 (H) 65 - 99 mg/dL   BUN 41 (H) 6 - 20 mg/dL   Creatinine, Ser 1.80 (H)  0.44 - 1.00 mg/dL   Calcium 8.9 8.9 - 10.3 mg/dL   Total Protein 7.1 6.5 - 8.1 g/dL   Albumin 3.4 (L) 3.5 - 5.0 g/dL   AST 22 15 - 41 U/L   ALT 22 14 - 54 U/L   Alkaline Phosphatase 71 38 - 126 U/L   Total Bilirubin 0.2 (L) 0.3 - 1.2 mg/dL   GFR calc non Af Amer 28 (L) >60 mL/min   GFR calc Af Amer 32 (L) >60 mL/min    Comment: (NOTE) The eGFR has been calculated using the CKD EPI equation. This calculation has not been validated in all clinical situations. eGFR's persistently <60 mL/min signify possible Chronic Kidney Disease.    Anion gap 13 5 - 15  Troponin I     Status: Abnormal   Collection Time: 08/31/14  5:24 PM  Result Value Ref Range   Troponin I 0.07 (H) <0.031 ng/mL    Comment: READ BACK AND VERIFIED BILL SMITH 09/10/2014 1811 [facility]        PERSISTENTLY INCREASED TROPONIN VALUES IN THE RANGE OF 0.04-0.49 ng/mL  CAN BE SEEN IN:       -UNSTABLE ANGINA       -CONGESTIVE HEART FAILURE       -MYOCARDITIS       -CHEST TRAUMA       -ARRYHTHMIAS       -LATE PRESENTING MYOCARDIAL INFARCTION       -COPD   CLINICAL FOLLOW-UP RECOMMENDED.   Troponin I     Status: Abnormal   Collection Time: 08/31/14  8:08 PM  Result Value Ref Range   Troponin I 0.07 (H) <0.031 ng/mL    Comment: RESULTS PREVIOUSLY CALLED BY [facility] AT 1810 08/31/14.Marland KitchenMarland KitchenSMG        PERSISTENTLY INCREASED TROPONIN VALUES IN THE RANGE OF 0.04-0.49 ng/mL CAN BE SEEN IN:       -UNSTABLE ANGINA       -CONGESTIVE HEART FAILURE       -MYOCARDITIS       -CHEST TRAUMA       -ARRYHTHMIAS       -LATE PRESENTING MYOCARDIAL INFARCTION       -COPD   CLINICAL FOLLOW-UP RECOMMENDED.   Glucose, capillary     Status: Abnormal   Collection Time: 08/31/14  8:44 PM  Result Value Ref Range   Glucose-Capillary 236 (H) 65 - 99 mg/dL   Comment 1 Notify RN   Troponin I     Status: Abnormal   Collection Time: 09/01/14 12:23 AM  Result Value Ref Range   Troponin I 0.07 (H) <0.031 ng/mL    Comment: RESULTS PREVIOUSLY CALLED BY [facility] AT 1749 ON 08/31/14 RWW        PERSISTENTLY INCREASED TROPONIN VALUES IN THE RANGE OF 0.04-0.49 ng/mL CAN BE SEEN IN:       -UNSTABLE ANGINA       -CONGESTIVE HEART FAILURE       -MYOCARDITIS       -CHEST TRAUMA       -ARRYHTHMIAS       -LATE PRESENTING MYOCARDIAL INFARCTION       -COPD   CLINICAL FOLLOW-UP RECOMMENDED.   Troponin I     Status: Abnormal   Collection Time: 09/01/14  3:07 AM  Result Value Ref Range   Troponin I 0.07 (H) <0.031 ng/mL    Comment: RESULTS PREVIOUSLY CALLED AT 1811 08/31/14.[facility]        PERSISTENTLY INCREASED TROPONIN VALUES IN THE RANGE OF  0.04-0.49 ng/mL CAN BE SEEN IN:       -UNSTABLE ANGINA       -CONGESTIVE HEART FAILURE       -MYOCARDITIS       -CHEST TRAUMA       -ARRYHTHMIAS       -LATE PRESENTING MYOCARDIAL INFARCTION       -COPD   CLINICAL FOLLOW-UP RECOMMENDED.   Lipid panel      Status: Abnormal   Collection Time: 09/01/14  3:07 AM  Result Value Ref Range   Cholesterol 238 (H) 0 - 200 mg/dL   Triglycerides 321 (H) <150 mg/dL   HDL 29 (L) >40 mg/dL   Total CHOL/HDL Ratio 8.2 RATIO   VLDL 64 (H) 0 - 40 mg/dL   LDL Cholesterol 145 (H) 0 - 99 mg/dL    Comment:        Total Cholesterol/HDL:CHD Risk Coronary Heart Disease Risk Table                     Men   Women  1/2 Average Risk   3.4   3.3  Average Risk       5.0   4.4  2 X Average Risk   9.6   7.1  3 X Average Risk  23.4   11.0        Use the calculated Patient Ratio above and the CHD Risk Table to determine the patient's CHD Risk.        ATP III CLASSIFICATION (LDL):  <100     mg/dL   Optimal  100-129  mg/dL   Near or Above                    Optimal  130-159  mg/dL   Borderline  160-189  mg/dL   High  >190     mg/dL   Very High   Basic metabolic panel     Status: Abnormal   Collection Time: 09/01/14  3:07 AM  Result Value Ref Range   Sodium 140 135 - 145 mmol/L   Potassium 4.1 3.5 - 5.1 mmol/L   Chloride 100 (L) 101 - 111 mmol/L   CO2 30 22 - 32 mmol/L   Glucose, Bld 199 (H) 65 - 99 mg/dL   BUN 41 (H) 6 - 20 mg/dL   Creatinine, Ser 1.84 (H) 0.44 - 1.00 mg/dL   Calcium 8.5 (L) 8.9 - 10.3 mg/dL   GFR calc non Af Amer 27 (L) >60 mL/min   GFR calc Af Amer 31 (L) >60 mL/min    Comment: (NOTE) The eGFR has been calculated using the CKD EPI equation. This calculation has not been validated in all clinical situations. eGFR's persistently <60 mL/min signify possible Chronic Kidney Disease.    Anion gap 10 5 - 15  CBC     Status: Abnormal   Collection Time: 09/01/14  3:07 AM  Result Value Ref Range   WBC 8.4 3.6 - 11.0 K/uL   RBC 3.78 (L) 3.80 - 5.20 MIL/uL   Hemoglobin 11.3 (L) 12.0 - 16.0 g/dL   HCT 33.4 (L) 35.0 - 47.0 %   MCV 88.2 80.0 - 100.0 fL   MCH 29.9 26.0 - 34.0 pg   MCHC 33.8 32.0 - 36.0 g/dL   RDW 13.3 11.5 - 14.5 %   Platelets 191 150 - 440 K/uL  Glucose, capillary      Status: Abnormal   Collection Time: 09/01/14  7:20 AM  Result Value Ref Range   Glucose-Capillary 158 (H) 65 - 99 mg/dL  Glucose, capillary     Status: Abnormal   Collection Time: 09/01/14 12:11 PM  Result Value Ref Range   Glucose-Capillary 207 (H) 65 - 99 mg/dL    Dg Chest 2 View  08/31/2014   CLINICAL DATA:  Chest pain with left facial droop. In assisted living. Initial encounter.  EXAM: CHEST  2 VIEW  COMPARISON:  09/08/2012 radiographs.  FINDINGS: The heart size and mediastinal contours are stable status post median sternotomy and CABG. Chronic pleural thickening and parenchymal scarring at the left lung base are stable. There is stable mild right basilar atelectasis. No edema, confluent airspace opacity or significant pleural effusion identified. The bones appear unchanged.  IMPRESSION: Stable chronic findings post CABG. No acute cardiopulmonary process identified.   Electronically Signed   By: Richardean Sale M.D.   On: 08/31/2014 17:51    Review of Systems  Constitutional: Positive for malaise/fatigue.  HENT: Positive for congestion.   Eyes: Negative.   Respiratory: Positive for shortness of breath.   Cardiovascular: Positive for chest pain, palpitations and leg swelling.  Gastrointestinal: Negative.   Genitourinary: Negative.   Musculoskeletal: Positive for myalgias, joint pain and falls.  Skin: Negative.   Neurological: Positive for tremors, focal weakness and weakness.  Endo/Heme/Allergies: Negative.    Blood pressure 123/52, pulse 57, temperature 98.6 F (37 C), temperature source Oral, resp. rate 18, height _0  (1.651 m), weight 110.587 kg (243 lb 12.8 oz), SpO2 97 %. Physical Exam  Nursing note and vitals reviewed. Constitutional: She appears well-developed and well-nourished.  HENT:  Head: Normocephalic and atraumatic.  Right Ear: External ear normal.  Eyes: Conjunctivae and EOM are normal. Pupils are equal, round, and reactive to light.  Neck: Neck supple.   Cardiovascular: S1 normal, S2 normal, intact distal pulses and normal pulses.  An irregularly irregular rhythm present. Exam reveals gallop, S3 and S4.   Murmur heard.  Systolic murmur is present with a grade of 2/6    Assessment/Plan:  angina  chest pain  coronary artery disease  coronary bypass surgery  abnormal EKG  CVA  chronic renal insufficiency  COPD  hyperlipidemia  congestive heart failure  anxiety  diabetes  obstructive sleep apnea . PLAN  agree with admitted to telemetry for left myocardial infarction  follow-up troponins  follow-up abnormal EKGs  agree with echocardiogram for evaluation of left ventricular function  continue diabetes management control  lipid management for hyperlipidemia  aspirin therapy for  CVA  continue Imdur therapy for possible angina increase to 60 mg once a day  continue blood pressure control with enalapril amlodipine metoprolol  do not recommend cardiac catheterization at this stage  I recommend conservative cardiology care with follow-up as an outpatient  patient should be safe to return back to the nursing home since she is pain free  Nichalas Coin D. 09/01/2014, 12:50 PM

## 2014-09-01 NOTE — Discharge Summary (Signed)
[facility] [place] [facility] [place] NAME: Susan Graham    MR#:  [id number]  DATE OF BIRTH:  Apr 26, 1945  DATE OF ADMISSION:  08/31/2014 ADMITTING PHYSICIAN: Vaughan Basta, MD  DATE OF DISCHARGE: 09/01/2014 PRIMARY CARE PHYSICIAN: Arnette Norris, MD    ADMISSION DIAGNOSIS:  NSTEMI (non-ST elevated myocardial infarction) [I21.4] Chest pain, unspecified chest pain type [R07.9]  DISCHARGE DIAGNOSIS:  Principal Problem:   Angina at rest Active Problems:   Pressure ulcer   SECONDARY DIAGNOSIS:   Past Medical History  Diagnosis Date  . Acute on chronic respiratory failure   . CHF (congestive heart failure)   . Hypertension   . Anxiety   . COPD (chronic obstructive pulmonary disease)   . Diabetes mellitus without complication   . Coronary artery disease   . Stroke   . Chronic kidney disease     HOSPITAL COURSE:  69 year old female with a history of acute and chronic respiratory failure on oxygen, congestive heart failure diastolic, COPD, essential hypertension and coronary disease status post CABG who presents with chest pain/angina. For further details please further H&P.   1. Chest pain, angina: Patient was evaluated for non-ST elevation MI/crit coronary syndrome. Her troponin was very minimally elevated and flat suggestive of poor renal clearance and not acute coronary syndrome. She had no chest pain while in the hospital. She'll need close follow-up with her outpatient cardiologist for further evaluation and management.  2. Hyperlipidemia: Patient did have it elevated LDL. She was started on statin medication. She was provided risks, benefits and alternatives medication. Patient did want to start on statin medication.  3. CAD/CABG: Patient will continue aspirin, statin, enalapril and metoprolol.  4. Diabetes: Patient continue outpatient medications including Lantus  5. Essential hypertension: Patient's blood pressure was adequately  controlled. She will continue Imdur, metoprolol, enalapril  6. Chronic respiratory failure with COPD: Patient did not have COPD exacerbation: Patient will continue on her outpatient inhalers as well as oxygen.  7. Depression/anxiety: Patient continue on Effexor.  8. Diastolic heart failure: There is no evidence of exacerbation. Patient continue Lasix and metoprolol. DISCHARGE CONDITIONS AND DIET:  Patient is being discharged in stable condition on a heart healthy/diabetic diet  CONSULTS OBTAINED:  Treatment Team:  Yolonda Kida, MD  DRUG ALLERGIES:   Allergies  Allergen Reactions  . Codeine Other (See Comments)    Reaction:  Unknown   . Levaquin [Levofloxacin In D5w] Other (See Comments)    Reaction:  Unknown   . Risperdal [Risperidone] Other (See Comments)    Reaction:  Unknown   . Sulfa Antibiotics Other (See Comments)    Reaction:  Unknown     DISCHARGE MEDICATIONS:   Current Discharge Medication List    START taking these medications   Details  atorvastatin (LIPITOR) 40 MG tablet Take 1 tablet (40 mg total) by mouth daily at 6 PM. Qty: 30 tablet, Refills: 0      CONTINUE these medications which have NOT CHANGED   Details  !! acetaminophen (TYLENOL) 325 MG tablet Take 650 mg by mouth every 4 (four) hours as needed for mild pain or fever.    !! acetaminophen (TYLENOL) 500 MG tablet Take 500 mg by mouth daily.    ALPRAZolam (XANAX) 0.25 MG tablet Take 0.25 mg by mouth 2 (two) times daily as needed for anxiety.    amLODipine (NORVASC) 5 MG tablet Take 5 mg by mouth daily.    ARTIFICIAL TEAR OP Apply 1 drop to  eye 4 (four) times daily as needed (for dry eyes).    aspirin EC 325 MG tablet Take 325 mg by mouth daily.    bisacodyl (DULCOLAX) 10 MG suppository Place 10 mg rectally as needed for moderate constipation.    buPROPion (WELLBUTRIN XL) 150 MG 24 hr tablet Take 150 mg by mouth daily.    Diphenhyd-Hydrocort-Nystatin (FIRST-DUKES MOUTHWASH) SUSP Take 5  mLs by mouth 3 (three) times daily as needed (for sore throat). Pt swishes and spits.    divalproex (DEPAKOTE) 500 MG DR tablet Take 500 mg by mouth 2 (two) times daily.    enalapril (VASOTEC) 2.5 MG tablet Take 2.5 mg by mouth daily.    fentaNYL (DURAGESIC - DOSED MCG/HR) 12 MCG/HR Place 12.5 mcg onto the skin every 3 (three) days.    furosemide (LASIX) 80 MG tablet Take 80 mg by mouth 2 (two) times daily.    HYDROcodone-acetaminophen (NORCO/VICODIN) 5-325 MG per tablet Take 1 tablet by mouth every 4 (four) hours as needed for moderate pain.     insulin glargine (LANTUS) 100 UNIT/ML injection Inject 15-60 Units into the skin 2 (two) times daily. Pt uses 15 units in the morning and 60 units every evening.    isosorbide mononitrate (IMDUR) 30 MG 24 hr tablet Take 30 mg by mouth daily.    levothyroxine (SYNTHROID, LEVOTHROID) 75 MCG tablet Take 75 mcg by mouth daily before breakfast.    metoprolol succinate (TOPROL-XL) 50 MG 24 hr tablet Take 50 mg by mouth daily.    polyethylene glycol (MIRALAX / GLYCOLAX) packet Take 17 g by mouth at bedtime.    potassium chloride (K-DUR) 10 MEQ tablet Take 10 mEq by mouth daily.    senna (SENOKOT) 8.6 MG tablet Take 1 tablet by mouth 2 (two) times daily.    thiothixene (NAVANE) 2 MG capsule Take 2 mg by mouth 2 (two) times daily.    tiotropium (SPIRIVA) 18 MCG inhalation capsule Place 18 mcg into inhaler and inhale daily.    traZODone (DESYREL) 50 MG tablet Take 25 mg by mouth at bedtime as needed for sleep.    venlafaxine XR (EFFEXOR-XR) 150 MG 24 hr capsule Take 150 mg by mouth at bedtime.     !! - Potential duplicate medications found. Please discuss with provider.            Today   CHIEF COMPLAINT:  Patient has no complaints this morning. Patient denies chest pain or shortness of breath. Patient does not ambulate at the nursing home.   VITAL SIGNS:  Blood pressure 121/39, pulse 55, temperature 98.2 F (36.8 C), temperature  source Oral, resp. rate 20, height 5\' 5"  (1.651 m), weight 110.587 kg (243 lb 12.8 oz), SpO2 99 %.   REVIEW OF SYSTEMS:  Review of Systems  Constitutional: Negative for fever, chills and malaise/fatigue.  HENT: Negative for sore throat.   Eyes: Negative for blurred vision.  Respiratory: Negative for cough, hemoptysis, shortness of breath and wheezing.   Cardiovascular: Negative for chest pain, palpitations and leg swelling.  Gastrointestinal: Negative for nausea, vomiting, abdominal pain, diarrhea and blood in stool.  Genitourinary: Negative for dysuria.  Musculoskeletal: Negative for back pain.  Neurological: Positive for weakness. Negative for dizziness, tremors and headaches.  Endo/Heme/Allergies: Does not bruise/bleed easily.     PHYSICAL EXAMINATION:  GENERAL:  69 y.o.-year-old patient lying in the bed with no acute distress.  NECK:  Supple, no jugular venous distention. No thyroid enlargement, no tenderness.  LUNGS: Normal breath sounds bilaterally,  no wheezing, rales,rhonchi  No use of accessory muscles of respiration.  CARDIOVASCULAR: S1, S2 normal. No murmurs, rubs, or gallops.  ABDOMEN: Soft, non-tender, non-distended. Bowel sounds present. No organomegaly or mass.  EXTREMITIES: No pedal edema, cyanosis, or clubbing.  PSYCHIATRIC: The patient is alert and oriented x 3.  SKIN: No obvious rash, lesion, or ulcer.   DATA REVIEW:   CBC  Recent Labs Lab 09/01/14 0307  WBC 8.4  HGB 11.3*  HCT 33.4*  PLT 191    Chemistries   Recent Labs Lab 08/31/14 1724 09/01/14 0307  NA 136 140  K 4.2 4.1  CL 95* 100*  CO2 28 30  GLUCOSE 301* 199*  BUN 41* 41*  CREATININE 1.80* 1.84*  CALCIUM 8.9 8.5*  AST 22  --   ALT 22  --   ALKPHOS 71  --   BILITOT 0.2*  --     Cardiac Enzymes  Recent Labs Lab 08/31/14 2008 09/01/14 0023 09/01/14 0307  TROPONINI 0.07* 0.07* 0.07*    Microbiology Results  @[email or web address] @  RADIOLOGY:  Dg Chest 2 View  08/31/2014    CLINICAL DATA:  Chest pain with left facial droop. In assisted living. Initial encounter.  EXAM: CHEST  2 VIEW  COMPARISON:  09/08/2012 radiographs.  FINDINGS: The heart size and mediastinal contours are stable status post median sternotomy and CABG. Chronic pleural thickening and parenchymal scarring at the left lung base are stable. There is stable mild right basilar atelectasis. No edema, confluent airspace opacity or significant pleural effusion identified. The bones appear unchanged.  IMPRESSION: Stable chronic findings post CABG. No acute cardiopulmonary process identified.   Electronically Signed   By: Richardean Sale M.D.   On: 08/31/2014 17:51      Management plans discussed with the patient and she is in agreement. Stable for discharge skilled nursing facility  Patient should follow up with cardiology in 1 week  CODE STATUS:     Code Status Orders        Start     Ordered   08/31/14 2045  Do not attempt resuscitation (DNR)   Continuous    Question Answer Comment  In the event of cardiac or respiratory ARREST Do not call a "code blue"   In the event of cardiac or respiratory ARREST Do not perform Intubation, CPR, defibrillation or ACLS   In the event of cardiac or respiratory ARREST Use medication by any route, position, wound care, and other measures to relive pain and suffering. May use oxygen, suction and manual treatment of airway obstruction as needed for comfort.      08/31/14 2044    Advance Directive Documentation        Most Recent Value   Type of Advance Directive  Healthcare Power of Attorney, Living will   Pre-existing out of facility DNR order (yellow form or pink MOST form)  Yellow form placed in chart (order not valid for inpatient use)   "MOST" Form in Place?        TOTAL TIME TAKING CARE OF THIS PATIENT: 35 minutes.    Shelly Spenser M.D on 09/01/2014 at 11:24 AM  Between 7am to 6pm - Pager - [phone] After 6pm go to [email or web address] - password [email or web address]  [facility]  Office  [phone]  CC: Primary care physician; Arnette Norris, MD

## 2014-09-04 ENCOUNTER — Telehealth: Payer: Self-pay | Admitting: *Deleted

## 2014-09-04 NOTE — Telephone Encounter (Signed)
Transitional care call attempted.  Unable to reach patient or emergency contact at numbers provided.  No voice mail to leave message.  Will recall later.

## 2014-09-10 NOTE — Telephone Encounter (Signed)
Transitional care call attempted.  Unable to reach patient.  No f/u scheduled at this time.  Will continue to call.

## 2014-09-13 DIAGNOSIS — E119 Type 2 diabetes mellitus without complications: Secondary | ICD-10-CM

## 2014-09-13 DIAGNOSIS — I2510 Atherosclerotic heart disease of native coronary artery without angina pectoris: Secondary | ICD-10-CM

## 2014-09-13 DIAGNOSIS — F319 Bipolar disorder, unspecified: Secondary | ICD-10-CM

## 2014-09-13 DIAGNOSIS — I5032 Chronic diastolic (congestive) heart failure: Secondary | ICD-10-CM

## 2014-09-13 DIAGNOSIS — J439 Emphysema, unspecified: Secondary | ICD-10-CM | POA: Diagnosis not present

## 2014-09-14 NOTE — Telephone Encounter (Signed)
Unable to reach patient.  She has not been seen in our office since 2014.  No further follow up needed.

## 2014-11-14 DIAGNOSIS — F3162 Bipolar disorder, current episode mixed, moderate: Secondary | ICD-10-CM

## 2014-11-14 DIAGNOSIS — E1165 Type 2 diabetes mellitus with hyperglycemia: Secondary | ICD-10-CM

## 2014-11-14 DIAGNOSIS — E039 Hypothyroidism, unspecified: Secondary | ICD-10-CM | POA: Diagnosis not present

## 2014-11-14 DIAGNOSIS — I2510 Atherosclerotic heart disease of native coronary artery without angina pectoris: Secondary | ICD-10-CM

## 2014-11-14 DIAGNOSIS — I5030 Unspecified diastolic (congestive) heart failure: Secondary | ICD-10-CM | POA: Diagnosis not present

## 2014-11-14 DIAGNOSIS — J449 Chronic obstructive pulmonary disease, unspecified: Secondary | ICD-10-CM

## 2014-11-14 DIAGNOSIS — G479 Sleep disorder, unspecified: Secondary | ICD-10-CM

## 2015-01-15 DIAGNOSIS — J439 Emphysema, unspecified: Secondary | ICD-10-CM | POA: Diagnosis not present

## 2015-01-15 DIAGNOSIS — F3110 Bipolar disorder, current episode manic without psychotic features, unspecified: Secondary | ICD-10-CM

## 2015-01-15 DIAGNOSIS — M159 Polyosteoarthritis, unspecified: Secondary | ICD-10-CM | POA: Diagnosis not present

## 2015-01-15 DIAGNOSIS — E119 Type 2 diabetes mellitus without complications: Secondary | ICD-10-CM | POA: Diagnosis not present

## 2015-01-15 DIAGNOSIS — I5032 Chronic diastolic (congestive) heart failure: Secondary | ICD-10-CM

## 2015-02-21 DIAGNOSIS — J069 Acute upper respiratory infection, unspecified: Secondary | ICD-10-CM | POA: Diagnosis not present

## 2015-02-25 DIAGNOSIS — F309 Manic episode, unspecified: Secondary | ICD-10-CM

## 2015-03-20 DIAGNOSIS — I2510 Atherosclerotic heart disease of native coronary artery without angina pectoris: Secondary | ICD-10-CM

## 2015-03-20 DIAGNOSIS — E1165 Type 2 diabetes mellitus with hyperglycemia: Secondary | ICD-10-CM | POA: Diagnosis not present

## 2015-03-20 DIAGNOSIS — E039 Hypothyroidism, unspecified: Secondary | ICD-10-CM

## 2015-03-20 DIAGNOSIS — M1990 Unspecified osteoarthritis, unspecified site: Secondary | ICD-10-CM | POA: Diagnosis not present

## 2015-03-20 DIAGNOSIS — J449 Chronic obstructive pulmonary disease, unspecified: Secondary | ICD-10-CM

## 2015-03-20 DIAGNOSIS — F319 Bipolar disorder, unspecified: Secondary | ICD-10-CM

## 2015-03-20 DIAGNOSIS — I5030 Unspecified diastolic (congestive) heart failure: Secondary | ICD-10-CM | POA: Diagnosis not present

## 2015-05-09 DIAGNOSIS — F3173 Bipolar disorder, in partial remission, most recent episode manic: Secondary | ICD-10-CM

## 2015-05-09 DIAGNOSIS — M159 Polyosteoarthritis, unspecified: Secondary | ICD-10-CM

## 2015-05-09 DIAGNOSIS — E1159 Type 2 diabetes mellitus with other circulatory complications: Secondary | ICD-10-CM

## 2015-05-09 DIAGNOSIS — I5032 Chronic diastolic (congestive) heart failure: Secondary | ICD-10-CM | POA: Diagnosis not present

## 2015-05-09 DIAGNOSIS — I25119 Atherosclerotic heart disease of native coronary artery with unspecified angina pectoris: Secondary | ICD-10-CM | POA: Diagnosis not present

## 2015-06-21 DIAGNOSIS — B351 Tinea unguium: Secondary | ICD-10-CM

## 2015-07-09 DIAGNOSIS — F312 Bipolar disorder, current episode manic severe with psychotic features: Secondary | ICD-10-CM | POA: Diagnosis not present

## 2015-07-17 DIAGNOSIS — I2510 Atherosclerotic heart disease of native coronary artery without angina pectoris: Secondary | ICD-10-CM

## 2015-07-17 DIAGNOSIS — I5030 Unspecified diastolic (congestive) heart failure: Secondary | ICD-10-CM

## 2015-07-17 DIAGNOSIS — J449 Chronic obstructive pulmonary disease, unspecified: Secondary | ICD-10-CM

## 2015-07-17 DIAGNOSIS — M1990 Unspecified osteoarthritis, unspecified site: Secondary | ICD-10-CM | POA: Diagnosis not present

## 2015-07-17 DIAGNOSIS — F3181 Bipolar II disorder: Secondary | ICD-10-CM

## 2015-07-17 DIAGNOSIS — E1165 Type 2 diabetes mellitus with hyperglycemia: Secondary | ICD-10-CM | POA: Diagnosis not present

## 2015-07-17 DIAGNOSIS — E039 Hypothyroidism, unspecified: Secondary | ICD-10-CM

## 2015-09-23 DIAGNOSIS — J438 Other emphysema: Secondary | ICD-10-CM | POA: Diagnosis not present

## 2015-09-23 DIAGNOSIS — E1159 Type 2 diabetes mellitus with other circulatory complications: Secondary | ICD-10-CM | POA: Diagnosis not present

## 2015-09-23 DIAGNOSIS — I25119 Atherosclerotic heart disease of native coronary artery with unspecified angina pectoris: Secondary | ICD-10-CM | POA: Diagnosis not present

## 2015-09-23 DIAGNOSIS — F3110 Bipolar disorder, current episode manic without psychotic features, unspecified: Secondary | ICD-10-CM

## 2015-09-23 DIAGNOSIS — I5032 Chronic diastolic (congestive) heart failure: Secondary | ICD-10-CM | POA: Diagnosis not present

## 2015-11-13 DIAGNOSIS — E039 Hypothyroidism, unspecified: Secondary | ICD-10-CM

## 2015-11-13 DIAGNOSIS — J449 Chronic obstructive pulmonary disease, unspecified: Secondary | ICD-10-CM

## 2015-11-13 DIAGNOSIS — I5030 Unspecified diastolic (congestive) heart failure: Secondary | ICD-10-CM

## 2015-11-13 DIAGNOSIS — E119 Type 2 diabetes mellitus without complications: Secondary | ICD-10-CM

## 2015-11-13 DIAGNOSIS — I2510 Atherosclerotic heart disease of native coronary artery without angina pectoris: Secondary | ICD-10-CM | POA: Diagnosis not present

## 2015-11-13 DIAGNOSIS — M1990 Unspecified osteoarthritis, unspecified site: Secondary | ICD-10-CM

## 2015-11-18 DIAGNOSIS — R05 Cough: Secondary | ICD-10-CM | POA: Diagnosis not present

## 2016-01-08 ENCOUNTER — Other Ambulatory Visit: Payer: Self-pay | Admitting: Internal Medicine

## 2016-01-08 DIAGNOSIS — R928 Other abnormal and inconclusive findings on diagnostic imaging of breast: Secondary | ICD-10-CM | POA: Diagnosis not present

## 2016-01-14 ENCOUNTER — Encounter: Payer: Self-pay | Admitting: *Deleted

## 2016-01-16 DIAGNOSIS — I25119 Atherosclerotic heart disease of native coronary artery with unspecified angina pectoris: Secondary | ICD-10-CM | POA: Diagnosis not present

## 2016-01-16 DIAGNOSIS — F319 Bipolar disorder, unspecified: Secondary | ICD-10-CM

## 2016-01-16 DIAGNOSIS — I5032 Chronic diastolic (congestive) heart failure: Secondary | ICD-10-CM

## 2016-01-16 DIAGNOSIS — M159 Polyosteoarthritis, unspecified: Secondary | ICD-10-CM

## 2016-01-16 DIAGNOSIS — E1121 Type 2 diabetes mellitus with diabetic nephropathy: Secondary | ICD-10-CM

## 2016-01-16 DIAGNOSIS — J438 Other emphysema: Secondary | ICD-10-CM | POA: Diagnosis not present

## 2016-01-30 ENCOUNTER — Ambulatory Visit: Payer: Self-pay | Admitting: General Surgery

## 2016-01-31 ENCOUNTER — Encounter: Payer: Self-pay | Admitting: *Deleted

## 2016-02-11 ENCOUNTER — Ambulatory Visit (INDEPENDENT_AMBULATORY_CARE_PROVIDER_SITE_OTHER): Payer: Medicare Other | Admitting: General Surgery

## 2016-02-11 ENCOUNTER — Inpatient Hospital Stay: Payer: Self-pay

## 2016-02-11 ENCOUNTER — Encounter: Payer: Self-pay | Admitting: General Surgery

## 2016-02-11 VITALS — BP 132/74 | HR 92 | Resp 16 | Ht 64.0 in

## 2016-02-11 DIAGNOSIS — N6310 Unspecified lump in the right breast, unspecified quadrant: Secondary | ICD-10-CM

## 2016-02-11 DIAGNOSIS — N6311 Unspecified lump in the right breast, upper outer quadrant: Secondary | ICD-10-CM | POA: Diagnosis not present

## 2016-02-11 NOTE — Progress Notes (Signed)
Patient ID: Susan Graham, female   DOB: 1945-08-20, 71 y.o.   MRN: [id number]  Chief Complaint  Patient presents with  . Other    HPI Susan Graham is a 71 y.o. here today for a evaluation of her right breast mass. Her nursing home noticed this area about a month ago. Daughter, [name] is present.    The patient was also accompanied by 2 caregivers from the assisted-living facility where she has stayed for 4 years after discharged from hospice.                       HPI   Past Medical History:  Diagnosis Date  . Acute on chronic respiratory failure ([place])   . Anxiety   . CHF (congestive heart failure) ([place])   . Chronic kidney disease   . COPD (chronic obstructive pulmonary disease) ([place])   . Coronary artery disease   . Diabetes mellitus without complication ([place])   . Hypertension   . Stroke [facility])     Past Surgical History:  Procedure Laterality Date  . ABDOMINAL HYSTERECTOMY    . CORONARY ARTERY BYPASS GRAFT    . JOINT REPLACEMENT      Family History  Problem Relation Age of Onset  . Heart attack Father   . Diabetes Father   . Diabetes Brother     Social History Social History  Substance Use Topics  . Smoking status: Former Research scientist (life sciences)  . Smokeless tobacco: Not on file  . Alcohol use No    Allergies  Allergen Reactions  . Codeine Other (See Comments)    Reaction:  Unknown   . Levaquin [Levofloxacin In D5w] Other (See Comments)    Reaction:  Unknown   . Risperdal [Risperidone] Other (See Comments)    Reaction:  Unknown   . Sulfa Antibiotics Other (See Comments)    Reaction:  Unknown     Current Outpatient Prescriptions  Medication Sig Dispense Refill  . acetaminophen (TYLENOL) 325 MG tablet Take 650 mg by mouth every 4 (four) hours as needed for mild pain or fever.    Marland Kitchen acetaminophen (TYLENOL) 500 MG tablet Take 500 mg by mouth daily.    Marland Kitchen ALPRAZolam (XANAX) 0.25 MG tablet Take 0.25 mg by mouth 2 (two) times daily as needed for anxiety.    Marland Kitchen amLODipine  (NORVASC) 5 MG tablet Take 5 mg by mouth daily.    . ARTIFICIAL TEAR OP Apply 1 drop to eye 4 (four) times daily as needed (for dry eyes).    Marland Kitchen aspirin EC 325 MG tablet Take 325 mg by mouth daily.    Marland Kitchen atorvastatin (LIPITOR) 40 MG tablet Take 1 tablet (40 mg total) by mouth daily at 6 PM. 30 tablet 0  . bisacodyl (DULCOLAX) 10 MG suppository Place 10 mg rectally as needed for moderate constipation.    Marland Kitchen buPROPion (WELLBUTRIN XL) 150 MG 24 hr tablet Take 150 mg by mouth daily.    Marland Kitchen buPROPion (WELLBUTRIN XL) 300 MG 24 hr tablet     . Diphenhyd-Hydrocort-Nystatin (FIRST-DUKES MOUTHWASH) SUSP Take 5 mLs by mouth 3 (three) times daily as needed (for sore throat). Pt swishes and spits.    . divalproex (DEPAKOTE) 500 MG DR tablet Take 500 mg by mouth 2 (two) times daily.    . enalapril (VASOTEC) 2.5 MG tablet Take 2.5 mg by mouth daily.    . fentaNYL (DURAGESIC - DOSED MCG/HR) 12 MCG/HR Place 12.5 mcg onto the skin every  3 (three) days.    . furosemide (LASIX) 80 MG tablet Take 80 mg by mouth 2 (two) times daily.    Marland Kitchen HYDROcodone-acetaminophen (NORCO/VICODIN) 5-325 MG per tablet Take 1 tablet by mouth every 4 (four) hours as needed for moderate pain.     Marland Kitchen insulin glargine (LANTUS) 100 UNIT/ML injection Inject 15-60 Units into the skin 2 (two) times daily. Pt uses 15 units in the morning and 60 units every evening.    . isosorbide mononitrate (IMDUR) 30 MG 24 hr tablet Take 30 mg by mouth daily.    Marland Kitchen levothyroxine (SYNTHROID, LEVOTHROID) 75 MCG tablet Take 75 mcg by mouth daily before breakfast.    . metoprolol succinate (TOPROL-XL) 50 MG 24 hr tablet Take 50 mg by mouth daily.    . polyethylene glycol (MIRALAX / GLYCOLAX) packet Take 17 g by mouth at bedtime.    . polyethylene glycol powder (GLYCOLAX/MIRALAX) powder     . potassium chloride (K-DUR) 10 MEQ tablet Take 10 mEq by mouth daily.    Marland Kitchen senna (SENOKOT) 8.6 MG tablet Take 1 tablet by mouth 2 (two) times daily.    Marland Kitchen thiothixene (NAVANE) 2 MG  capsule Take 2 mg by mouth 2 (two) times daily.    Marland Kitchen tiotropium (SPIRIVA) 18 MCG inhalation capsule Place 18 mcg into inhaler and inhale daily.    . traZODone (DESYREL) 50 MG tablet Take 25 mg by mouth at bedtime as needed for sleep.    Marland Kitchen venlafaxine XR (EFFEXOR-XR) 150 MG 24 hr capsule Take 150 mg by mouth at bedtime.     No current facility-administered medications for this visit.     Review of Systems Review of Systems  Constitutional: Negative.   Respiratory: Negative.   Cardiovascular: Negative.     Blood pressure 132/74, pulse 92, resp. rate 16, height 5\' 4"  (1.626 m), SpO2 94 %.  Physical Exam Physical Exam  Constitutional: She is oriented to person, place, and time. She appears well-developed and well-nourished.  Eyes: Conjunctivae are normal. No scleral icterus.  Neck: Neck supple.  Cardiovascular: Normal rate, regular rhythm and normal heart sounds.   Pulmonary/Chest: Effort normal and breath sounds normal. Right breast exhibits no inverted nipple, no nipple discharge, no skin change and no tenderness. Left breast exhibits no inverted nipple, no mass, no nipple discharge, no skin change and no tenderness.    Lymphadenopathy:    She has no cervical adenopathy.  Neurological: She is alert and oriented to person, place, and time.  Skin: Skin is warm and dry.    Data Reviewed Skilled nursing report.  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   Plan    While the patient does not have a healthcare power of attorney, she is exceptionally emotionally labile, alternating between smiling and crying during the exam. Her daughter was a great help today, able to comfort her mother during her crying spells. Crying was not miscellaneous related to any adverse news.  The patient was quite cooperative during the exam. The left breast is unremarkable. 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  The patient's mobility is exceptionally  poor, and the exam was completed in the wheelchair. While a biopsy could be completed, and would be ideal to confirm the clinical impression, on not sure the patient would tolerate this based on today's air of action during her clinical exam.  The patient was discharged from hospice 4 years ago, and while she has innumerable medical conditions she  is not appeared to be one who is going to be succumbing to any of her other health issues and neck 6 months. Consideration can be given to observation alone or him. Treatment with a aromatase inhibitor with the idea this would most likely be a estrogen sensitive tumor. This might prevent further disease progression and erosion of the skin.  We'll speak with her primary care provider and make further plans from that point.  The patient's daughter was strongly encouraged to become her healthcare power of attorney, as her mother with her stroke, short and long-term memory issues is really not able to fully comprehend the medical issues that she faces.     This information has been scribed by Gaspar Cola CMA.   Robert Bellow 02/12/2016, 2:13 PM

## 2016-02-12 DIAGNOSIS — N6310 Unspecified lump in the right breast, unspecified quadrant: Secondary | ICD-10-CM | POA: Insufficient documentation

## 2016-02-24 DIAGNOSIS — N6091 Unspecified benign mammary dysplasia of right breast: Secondary | ICD-10-CM | POA: Diagnosis not present

## 2016-03-18 DIAGNOSIS — J449 Chronic obstructive pulmonary disease, unspecified: Secondary | ICD-10-CM | POA: Diagnosis not present

## 2016-03-18 DIAGNOSIS — E119 Type 2 diabetes mellitus without complications: Secondary | ICD-10-CM | POA: Diagnosis not present

## 2016-03-18 DIAGNOSIS — I110 Hypertensive heart disease with heart failure: Secondary | ICD-10-CM | POA: Diagnosis not present

## 2016-03-18 DIAGNOSIS — I5032 Chronic diastolic (congestive) heart failure: Secondary | ICD-10-CM | POA: Diagnosis not present

## 2016-03-20 DIAGNOSIS — I25110 Atherosclerotic heart disease of native coronary artery with unstable angina pectoris: Secondary | ICD-10-CM | POA: Diagnosis not present

## 2016-03-20 DIAGNOSIS — M1990 Unspecified osteoarthritis, unspecified site: Secondary | ICD-10-CM | POA: Diagnosis not present

## 2016-03-20 DIAGNOSIS — J441 Chronic obstructive pulmonary disease with (acute) exacerbation: Secondary | ICD-10-CM | POA: Diagnosis not present

## 2016-03-20 DIAGNOSIS — F3170 Bipolar disorder, currently in remission, most recent episode unspecified: Secondary | ICD-10-CM | POA: Diagnosis not present

## 2016-03-20 DIAGNOSIS — G309 Alzheimer's disease, unspecified: Secondary | ICD-10-CM

## 2016-03-20 DIAGNOSIS — E11610 Type 2 diabetes mellitus with diabetic neuropathic arthropathy: Secondary | ICD-10-CM

## 2016-03-20 DIAGNOSIS — I5030 Unspecified diastolic (congestive) heart failure: Secondary | ICD-10-CM | POA: Diagnosis not present

## 2016-04-06 ENCOUNTER — Telehealth: Payer: Self-pay

## 2016-04-06 ENCOUNTER — Inpatient Hospital Stay
Admission: EM | Admit: 2016-04-06 | Discharge: 2016-04-10 | DRG: 871 | Disposition: A | Discharge status: Hospice | Attending: Internal Medicine | Admitting: Internal Medicine

## 2016-04-06 ENCOUNTER — Encounter: Payer: Self-pay | Admitting: Emergency Medicine

## 2016-04-06 ENCOUNTER — Emergency Department

## 2016-04-06 DIAGNOSIS — Z90710 Acquired absence of both cervix and uterus: Secondary | ICD-10-CM

## 2016-04-06 DIAGNOSIS — E039 Hypothyroidism, unspecified: Secondary | ICD-10-CM | POA: Diagnosis present

## 2016-04-06 DIAGNOSIS — I5033 Acute on chronic diastolic (congestive) heart failure: Secondary | ICD-10-CM | POA: Diagnosis present

## 2016-04-06 DIAGNOSIS — Y95 Nosocomial condition: Secondary | ICD-10-CM | POA: Diagnosis present

## 2016-04-06 DIAGNOSIS — Z951 Presence of aortocoronary bypass graft: Secondary | ICD-10-CM | POA: Diagnosis not present

## 2016-04-06 DIAGNOSIS — N179 Acute kidney failure, unspecified: Secondary | ICD-10-CM | POA: Diagnosis present

## 2016-04-06 DIAGNOSIS — Z79899 Other long term (current) drug therapy: Secondary | ICD-10-CM

## 2016-04-06 DIAGNOSIS — Z794 Long term (current) use of insulin: Secondary | ICD-10-CM

## 2016-04-06 DIAGNOSIS — N3001 Acute cystitis with hematuria: Secondary | ICD-10-CM | POA: Diagnosis present

## 2016-04-06 DIAGNOSIS — Z8249 Family history of ischemic heart disease and other diseases of the circulatory system: Secondary | ICD-10-CM

## 2016-04-06 DIAGNOSIS — I5032 Chronic diastolic (congestive) heart failure: Secondary | ICD-10-CM | POA: Diagnosis present

## 2016-04-06 DIAGNOSIS — Z8673 Personal history of transient ischemic attack (TIA), and cerebral infarction without residual deficits: Secondary | ICD-10-CM | POA: Diagnosis not present

## 2016-04-06 DIAGNOSIS — I130 Hypertensive heart and chronic kidney disease with heart failure and stage 1 through stage 4 chronic kidney disease, or unspecified chronic kidney disease: Secondary | ICD-10-CM | POA: Diagnosis present

## 2016-04-06 DIAGNOSIS — I2510 Atherosclerotic heart disease of native coronary artery without angina pectoris: Secondary | ICD-10-CM | POA: Diagnosis present

## 2016-04-06 DIAGNOSIS — E1122 Type 2 diabetes mellitus with diabetic chronic kidney disease: Secondary | ICD-10-CM | POA: Diagnosis present

## 2016-04-06 DIAGNOSIS — Z881 Allergy status to other antibiotic agents status: Secondary | ICD-10-CM

## 2016-04-06 DIAGNOSIS — J189 Pneumonia, unspecified organism: Secondary | ICD-10-CM | POA: Diagnosis present

## 2016-04-06 DIAGNOSIS — A419 Sepsis, unspecified organism: Secondary | ICD-10-CM | POA: Diagnosis not present

## 2016-04-06 DIAGNOSIS — F419 Anxiety disorder, unspecified: Secondary | ICD-10-CM | POA: Diagnosis present

## 2016-04-06 DIAGNOSIS — Z515 Encounter for palliative care: Secondary | ICD-10-CM | POA: Diagnosis present

## 2016-04-06 DIAGNOSIS — Z66 Do not resuscitate: Secondary | ICD-10-CM | POA: Diagnosis present

## 2016-04-06 DIAGNOSIS — J440 Chronic obstructive pulmonary disease with acute lower respiratory infection: Secondary | ICD-10-CM | POA: Diagnosis present

## 2016-04-06 DIAGNOSIS — J9621 Acute and chronic respiratory failure with hypoxia: Secondary | ICD-10-CM | POA: Diagnosis present

## 2016-04-06 DIAGNOSIS — Z833 Family history of diabetes mellitus: Secondary | ICD-10-CM

## 2016-04-06 DIAGNOSIS — R059 Cough, unspecified: Secondary | ICD-10-CM

## 2016-04-06 DIAGNOSIS — I248 Other forms of acute ischemic heart disease: Secondary | ICD-10-CM | POA: Diagnosis present

## 2016-04-06 DIAGNOSIS — C50919 Malignant neoplasm of unspecified site of unspecified female breast: Secondary | ICD-10-CM | POA: Diagnosis present

## 2016-04-06 DIAGNOSIS — Z7401 Bed confinement status: Secondary | ICD-10-CM

## 2016-04-06 DIAGNOSIS — R4182 Altered mental status, unspecified: Secondary | ICD-10-CM

## 2016-04-06 DIAGNOSIS — R748 Abnormal levels of other serum enzymes: Secondary | ICD-10-CM | POA: Diagnosis present

## 2016-04-06 DIAGNOSIS — R1110 Vomiting, unspecified: Secondary | ICD-10-CM

## 2016-04-06 DIAGNOSIS — G9341 Metabolic encephalopathy: Secondary | ICD-10-CM | POA: Diagnosis present

## 2016-04-06 DIAGNOSIS — Z79891 Long term (current) use of opiate analgesic: Secondary | ICD-10-CM

## 2016-04-06 DIAGNOSIS — Z7982 Long term (current) use of aspirin: Secondary | ICD-10-CM

## 2016-04-06 DIAGNOSIS — N183 Chronic kidney disease, stage 3 (moderate): Secondary | ICD-10-CM | POA: Diagnosis present

## 2016-04-06 DIAGNOSIS — Z888 Allergy status to other drugs, medicaments and biological substances status: Secondary | ICD-10-CM

## 2016-04-06 DIAGNOSIS — N390 Urinary tract infection, site not specified: Secondary | ICD-10-CM

## 2016-04-06 DIAGNOSIS — R0602 Shortness of breath: Secondary | ICD-10-CM

## 2016-04-06 DIAGNOSIS — R05 Cough: Secondary | ICD-10-CM

## 2016-04-06 DIAGNOSIS — Z885 Allergy status to narcotic agent status: Secondary | ICD-10-CM

## 2016-04-06 DIAGNOSIS — Z87891 Personal history of nicotine dependence: Secondary | ICD-10-CM

## 2016-04-06 DIAGNOSIS — Z882 Allergy status to sulfonamides status: Secondary | ICD-10-CM

## 2016-04-06 HISTORY — DX: Malignant neoplasm of unspecified site of unspecified female breast: C50.919

## 2016-04-06 LAB — COMPREHENSIVE METABOLIC PANEL
ALBUMIN: 2.9 g/dL — AB (ref 3.5–5.0)
ALK PHOS: 54 U/L (ref 38–126)
ALT: 7 U/L — ABNORMAL LOW (ref 14–54)
AST: 11 U/L — ABNORMAL LOW (ref 15–41)
Anion gap: 9 (ref 5–15)
BUN: 52 mg/dL — AB (ref 6–20)
CALCIUM: 9.3 mg/dL (ref 8.9–10.3)
CO2: 26 mmol/L (ref 22–32)
Chloride: 98 mmol/L — ABNORMAL LOW (ref 101–111)
Creatinine, Ser: 2.21 mg/dL — ABNORMAL HIGH (ref 0.44–1.00)
GFR calc Af Amer: 25 mL/min — ABNORMAL LOW (ref >60)
GFR calc non Af Amer: 21 mL/min — ABNORMAL LOW (ref >60)
GLUCOSE: 112 mg/dL — AB (ref 65–99)
Potassium: 4.1 mmol/L (ref 3.5–5.1)
Sodium: 133 mmol/L — ABNORMAL LOW (ref 135–145)
Total Bilirubin: 0.6 mg/dL (ref 0.3–1.2)
Total Protein: 7.5 g/dL (ref 6.5–8.1)

## 2016-04-06 LAB — URINALYSIS, COMPLETE (UACMP) WITH MICROSCOPIC
BILIRUBIN URINE: NEGATIVE
Glucose, UA: NEGATIVE mg/dL
Ketones, ur: 5 mg/dL — AB
Nitrite: NEGATIVE
PROTEIN: 100 mg/dL — AB
SPECIFIC GRAVITY, URINE: 1.012 (ref 1.005–1.030)
pH: 5 (ref 5.0–8.0)

## 2016-04-06 LAB — CREATININE, SERUM
Creatinine, Ser: 2.02 mg/dL — ABNORMAL HIGH (ref 0.44–1.00)
GFR calc Af Amer: 28 mL/min — ABNORMAL LOW (ref >60)
GFR calc non Af Amer: 24 mL/min — ABNORMAL LOW (ref >60)

## 2016-04-06 LAB — TROPONIN I
Troponin I: 0.17 ng/mL (ref <0.03)
Troponin I: 0.18 ng/mL (ref <0.03)
Troponin I: 0.12 ng/mL (ref <0.03)

## 2016-04-06 LAB — CBC
HEMATOCRIT: 31.9 % — AB (ref 35.0–47.0)
HEMOGLOBIN: 10.5 g/dL — AB (ref 12.0–16.0)
MCH: 27.7 pg (ref 26.0–34.0)
MCHC: 33 g/dL (ref 32.0–36.0)
MCV: 83.9 fL (ref 80.0–100.0)
Platelets: 183 10*3/uL (ref 150–440)
RBC: 3.8 MIL/uL (ref 3.80–5.20)
RDW: 15.2 % — ABNORMAL HIGH (ref 11.5–14.5)
WBC: 21 10*3/uL — ABNORMAL HIGH (ref 3.6–11.0)

## 2016-04-06 LAB — LACTIC ACID, PLASMA
Lactic Acid, Venous: 0.6 mmol/L (ref 0.5–1.9)
Lactic Acid, Venous: 1.1 mmol/L (ref 0.5–1.9)

## 2016-04-06 LAB — MRSA PCR SCREENING: MRSA by PCR: NEGATIVE

## 2016-04-06 LAB — GLUCOSE, CAPILLARY
GLUCOSE-CAPILLARY: 104 mg/dL — AB (ref 65–99)
GLUCOSE-CAPILLARY: 108 mg/dL — AB (ref 65–99)
Glucose-Capillary: 116 mg/dL — ABNORMAL HIGH (ref 65–99)

## 2016-04-06 LAB — PROTIME-INR
INR: 1.24
PROTHROMBIN TIME: 15.7 s — AB (ref 11.4–15.2)

## 2016-04-06 LAB — APTT: APTT: 37 s — AB (ref 24–36)

## 2016-04-06 LAB — PROCALCITONIN: Procalcitonin: 0.21 ng/mL

## 2016-04-06 LAB — LIPASE, BLOOD: Lipase: 11 U/L (ref 11–51)

## 2016-04-06 LAB — MAGNESIUM: MAGNESIUM: 2.3 mg/dL (ref 1.7–2.4)

## 2016-04-06 MED ORDER — ACETAMINOPHEN 650 MG RE SUPP: 650.0000 mg | Freq: Four times a day (QID) | RECTAL | Status: DC | PRN

## 2016-04-06 MED ORDER — POLYETHYLENE GLYCOL 3350 17 G PO PACK
17.0000 g | PACK | Freq: Every day | ORAL | Status: DC
  Administered 2016-04-06 – 2016-04-10 (×5): 17 g via ORAL
  Filled 2016-04-06 (×5): qty 1

## 2016-04-06 MED ORDER — SENNA 8.6 MG PO TABS
1.0000 | ORAL_TABLET | Freq: Two times a day (BID) | ORAL | Status: DC
  Administered 2016-04-06 – 2016-04-10 (×9): 8.6 mg via ORAL
  Filled 2016-04-06 (×9): qty 1

## 2016-04-06 MED ORDER — BISACODYL 5 MG PO TBEC: 5.0000 mg | DELAYED_RELEASE_TABLET | Freq: Every day | ORAL | Status: DC | PRN

## 2016-04-06 MED ORDER — ALPRAZOLAM 0.25 MG PO TABS: 0.2500 mg | ORAL_TABLET | Freq: Two times a day (BID) | ORAL | Status: DC | PRN

## 2016-04-06 MED ORDER — HYDRALAZINE HCL 20 MG/ML IJ SOLN
10.0000 mg | Freq: Four times a day (QID) | INTRAMUSCULAR | Status: DC | PRN
  Administered 2016-04-09 – 2016-04-10 (×3): 10 mg via INTRAVENOUS
  Filled 2016-04-06 (×4): qty 1

## 2016-04-06 MED ORDER — ACETAMINOPHEN 500 MG PO TABS: 500.0000 mg | ORAL_TABLET | Freq: Every day | ORAL | Status: DC

## 2016-04-06 MED ORDER — SENNOSIDES-DOCUSATE SODIUM 8.6-50 MG PO TABS: 1.0000 | ORAL_TABLET | Freq: Every evening | ORAL | Status: DC | PRN

## 2016-04-06 MED ORDER — TRAZODONE HCL 50 MG PO TABS: 25.0000 mg | ORAL_TABLET | Freq: Every evening | ORAL | Status: DC | PRN

## 2016-04-06 MED ORDER — FENTANYL 12 MCG/HR TD PT72: 12.5000 ug | MEDICATED_PATCH | TRANSDERMAL | Status: DC

## 2016-04-06 MED ORDER — DEXTROSE 5 % IV SOLN
2.0000 g | INTRAVENOUS | Status: DC
  Administered 2016-04-06: 2 g via INTRAVENOUS
  Filled 2016-04-06 (×2): qty 2

## 2016-04-06 MED ORDER — THIOTHIXENE 2 MG PO CAPS
4.0000 mg | ORAL_CAPSULE | Freq: Two times a day (BID) | ORAL | Status: DC
  Administered 2016-04-06 – 2016-04-10 (×8): 4 mg via ORAL
  Filled 2016-04-06 (×9): qty 2

## 2016-04-06 MED ORDER — LEVOTHYROXINE SODIUM 75 MCG PO TABS
75.0000 ug | ORAL_TABLET | Freq: Every evening | ORAL | Status: DC
  Administered 2016-04-06 – 2016-04-09 (×4): 75 ug via ORAL
  Filled 2016-04-06 (×4): qty 1

## 2016-04-06 MED ORDER — SODIUM CHLORIDE 0.9 % IV BOLUS (SEPSIS)
1000.0000 mL | Freq: Once | INTRAVENOUS | Status: AC
  Administered 2016-04-06: 1000 mL via INTRAVENOUS

## 2016-04-06 MED ORDER — DIVALPROEX SODIUM 250 MG PO DR TAB
500.0000 mg | DELAYED_RELEASE_TABLET | Freq: Two times a day (BID) | ORAL | Status: DC
  Administered 2016-04-06 – 2016-04-10 (×9): 500 mg via ORAL
  Filled 2016-04-06 (×9): qty 2

## 2016-04-06 MED ORDER — HYDROCODONE-ACETAMINOPHEN 5-325 MG PO TABS: 1.0000 | ORAL_TABLET | ORAL | Status: DC | PRN

## 2016-04-06 MED ORDER — ONDANSETRON HCL 4 MG/2ML IJ SOLN
4.0000 mg | Freq: Once | INTRAMUSCULAR | Status: AC
  Administered 2016-04-06: 4 mg via INTRAVENOUS
  Filled 2016-04-06: qty 2

## 2016-04-06 MED ORDER — POTASSIUM CHLORIDE ER 10 MEQ PO TBCR
10.0000 meq | EXTENDED_RELEASE_TABLET | Freq: Every day | ORAL | Status: DC
  Administered 2016-04-06 – 2016-04-10 (×5): 10 meq via ORAL
  Filled 2016-04-06 (×9): qty 1

## 2016-04-06 MED ORDER — BUPROPION HCL ER (XL) 150 MG PO TB24
150.0000 mg | ORAL_TABLET | Freq: Every day | ORAL | Status: DC
  Administered 2016-04-06 – 2016-04-10 (×5): 150 mg via ORAL
  Filled 2016-04-06 (×5): qty 1

## 2016-04-06 MED ORDER — INSULIN ASPART 100 UNIT/ML ~~LOC~~ SOLN
0.0000 [IU] | Freq: Three times a day (TID) | SUBCUTANEOUS | Status: DC
  Administered 2016-04-07: 1 [IU] via SUBCUTANEOUS
  Administered 2016-04-07: 3 [IU] via SUBCUTANEOUS
  Administered 2016-04-07: 1 [IU] via SUBCUTANEOUS
  Administered 2016-04-08: 3 [IU] via SUBCUTANEOUS
  Administered 2016-04-08: 1 [IU] via SUBCUTANEOUS
  Administered 2016-04-08 – 2016-04-09 (×2): 3 [IU] via SUBCUTANEOUS
  Administered 2016-04-09: 2 [IU] via SUBCUTANEOUS
  Administered 2016-04-09: 1 [IU] via SUBCUTANEOUS
  Administered 2016-04-10 (×2): 2 [IU] via SUBCUTANEOUS
  Filled 2016-04-06: qty 3
  Filled 2016-04-06: qty 2
  Filled 2016-04-06: qty 1
  Filled 2016-04-06: qty 3
  Filled 2016-04-06: qty 2
  Filled 2016-04-06 (×2): qty 1
  Filled 2016-04-06: qty 2
  Filled 2016-04-06: qty 3
  Filled 2016-04-06: qty 2
  Filled 2016-04-06: qty 1

## 2016-04-06 MED ORDER — ONDANSETRON HCL 4 MG/2ML IJ SOLN
4.0000 mg | Freq: Four times a day (QID) | INTRAMUSCULAR | Status: DC | PRN
  Administered 2016-04-08: 4 mg via INTRAVENOUS
  Filled 2016-04-06: qty 2

## 2016-04-06 MED ORDER — MAGIC MOUTHWASH: 5.0000 mL | Freq: Three times a day (TID) | ORAL | Status: DC | PRN

## 2016-04-06 MED ORDER — DEXTROSE 5 % IV SOLN: 1.0000 g | Freq: Once | INTRAVENOUS | Status: DC

## 2016-04-06 MED ORDER — ISOSORBIDE MONONITRATE ER 30 MG PO TB24
30.0000 mg | ORAL_TABLET | Freq: Every day | ORAL | Status: DC
  Administered 2016-04-06 – 2016-04-10 (×5): 30 mg via ORAL
  Filled 2016-04-06 (×5): qty 1

## 2016-04-06 MED ORDER — CEFTRIAXONE SODIUM-DEXTROSE 1-3.74 GM-% IV SOLR
1.0000 g | Freq: Once | INTRAVENOUS | Status: AC
  Administered 2016-04-06: 1 g via INTRAVENOUS
  Filled 2016-04-06: qty 50

## 2016-04-06 MED ORDER — VANCOMYCIN HCL 10 G IV SOLR
1250.0000 mg | INTRAVENOUS | Status: DC
  Filled 2016-04-06: qty 1250

## 2016-04-06 MED ORDER — ONDANSETRON HCL 4 MG PO TABS: 4.0000 mg | ORAL_TABLET | Freq: Four times a day (QID) | ORAL | Status: DC | PRN

## 2016-04-06 MED ORDER — VANCOMYCIN HCL IN DEXTROSE 1-5 GM/200ML-% IV SOLN
1000.0000 mg | Freq: Once | INTRAVENOUS | Status: AC
  Administered 2016-04-06: 1000 mg via INTRAVENOUS
  Filled 2016-04-06: qty 200

## 2016-04-06 MED ORDER — AMLODIPINE BESYLATE 5 MG PO TABS
5.0000 mg | ORAL_TABLET | Freq: Every day | ORAL | Status: DC
  Administered 2016-04-06 – 2016-04-07 (×2): 5 mg via ORAL
  Filled 2016-04-06 (×2): qty 1

## 2016-04-06 MED ORDER — ALBUTEROL SULFATE (2.5 MG/3ML) 0.083% IN NEBU: 2.5000 mg | INHALATION_SOLUTION | RESPIRATORY_TRACT | Status: DC | PRN

## 2016-04-06 MED ORDER — ACETAMINOPHEN 325 MG PO TABS
650.0000 mg | ORAL_TABLET | Freq: Four times a day (QID) | ORAL | Status: DC | PRN
  Administered 2016-04-08 – 2016-04-10 (×2): 650 mg via ORAL
  Filled 2016-04-06 (×2): qty 2

## 2016-04-06 MED ORDER — TRAMADOL HCL 50 MG PO TABS
50.0000 mg | ORAL_TABLET | Freq: Three times a day (TID) | ORAL | Status: DC
  Administered 2016-04-06 – 2016-04-10 (×12): 50 mg via ORAL
  Filled 2016-04-06 (×13): qty 1

## 2016-04-06 MED ORDER — INSULIN ASPART 100 UNIT/ML ~~LOC~~ SOLN
0.0000 [IU] | Freq: Every day | SUBCUTANEOUS | Status: DC
  Administered 2016-04-08: 2 [IU] via SUBCUTANEOUS
  Filled 2016-04-06: qty 2

## 2016-04-06 MED ORDER — SODIUM CHLORIDE 0.9% FLUSH
3.0000 mL | Freq: Two times a day (BID) | INTRAVENOUS | Status: DC
Start: 2016-04-06 — End: 2016-04-10
  Administered 2016-04-06 – 2016-04-10 (×7): 3 mL via INTRAVENOUS

## 2016-04-06 MED ORDER — VENLAFAXINE HCL ER 75 MG PO CP24
150.0000 mg | ORAL_CAPSULE | Freq: Every day | ORAL | Status: DC
  Administered 2016-04-06 – 2016-04-09 (×4): 150 mg via ORAL
  Filled 2016-04-06 (×4): qty 2

## 2016-04-06 MED ORDER — ORAL CARE MOUTH RINSE
15.0000 mL | Freq: Two times a day (BID) | OROMUCOSAL | Status: DC
  Administered 2016-04-06 – 2016-04-10 (×7): 15 mL via OROMUCOSAL

## 2016-04-06 MED ORDER — TIOTROPIUM BROMIDE MONOHYDRATE 18 MCG IN CAPS
18.0000 ug | ORAL_CAPSULE | Freq: Every day | RESPIRATORY_TRACT | Status: DC
  Administered 2016-04-06 – 2016-04-10 (×5): 18 ug via RESPIRATORY_TRACT
  Filled 2016-04-06: qty 5

## 2016-04-06 MED ORDER — SODIUM CHLORIDE 0.9 % IV SOLN
INTRAVENOUS | Status: DC
  Administered 2016-04-06 – 2016-04-07 (×3): via INTRAVENOUS

## 2016-04-06 MED ORDER — METOPROLOL SUCCINATE ER 50 MG PO TB24
50.0000 mg | ORAL_TABLET | Freq: Every day | ORAL | Status: DC
  Administered 2016-04-06 – 2016-04-10 (×5): 50 mg via ORAL
  Filled 2016-04-06 (×5): qty 1

## 2016-04-06 MED ORDER — HEPARIN SODIUM (PORCINE) 5000 UNIT/ML IJ SOLN
5000.0000 [IU] | Freq: Three times a day (TID) | INTRAMUSCULAR | Status: DC
  Administered 2016-04-06 – 2016-04-10 (×12): 5000 [IU] via SUBCUTANEOUS
  Filled 2016-04-06 (×12): qty 1

## 2016-04-06 MED ORDER — BISACODYL 10 MG RE SUPP
10.0000 mg | RECTAL | Status: DC | PRN
Start: 2016-04-06 — End: 2016-04-10
  Administered 2016-04-10: 10 mg via RECTAL
  Filled 2016-04-06: qty 1

## 2016-04-06 MED ORDER — ATORVASTATIN CALCIUM 20 MG PO TABS
40.0000 mg | ORAL_TABLET | Freq: Every day | ORAL | Status: DC
Start: 2016-04-06 — End: 2016-04-10
  Administered 2016-04-06 – 2016-04-09 (×4): 40 mg via ORAL
  Filled 2016-04-06 (×4): qty 2

## 2016-04-06 MED ORDER — DEXTROSE 5 % IV SOLN
2.0000 g | Freq: Once | INTRAVENOUS | Status: DC
  Filled 2016-04-06: qty 2

## 2016-04-06 MED ORDER — GUAIFENESIN-DM 100-10 MG/5ML PO SYRP: 5.0000 mL | ORAL_SOLUTION | ORAL | Status: DC | PRN

## 2016-04-06 MED ORDER — ASPIRIN EC 325 MG PO TBEC
325.0000 mg | DELAYED_RELEASE_TABLET | Freq: Every day | ORAL | Status: DC
  Administered 2016-04-06 – 2016-04-10 (×5): 325 mg via ORAL
  Filled 2016-04-06 (×5): qty 1

## 2016-04-06 NOTE — ED Provider Notes (Signed)
[facility] Emergency Department Provider Note  Time seen: 7:44 AM  I have reviewed the triage vital signs and the nursing notes.   HISTORY  Chief Complaint Emesis    HPI Susan Graham is a 71 y.o. female with a past medical history of anxiety, CHF, CK D, COPD, diabetes, hypertension, CVA who presents to the emergency department with vomiting. According to EMS report patient is from [facility] [place], was found of multiple episodes of vomiting this morning. Patient is very somnolent this morning. She will awaken to answer questions with questionable accuracy, denies any complaints at this time. Denies any abdominal pain. Denies diarrhea.  Past Medical History:  Diagnosis Date  . Acute on chronic respiratory failure ([place])   . Anxiety   . CHF (congestive heart failure) ([place])   . Chronic kidney disease   . COPD (chronic obstructive pulmonary disease) ([place])   . Coronary artery disease   . Diabetes mellitus without complication ([place])   . Hypertension   . Stroke [facility])     Patient Active Problem List   Diagnosis Date Noted  . Breast mass, right 02/12/2016  . Pressure ulcer 09/01/2014  . Angina at rest [facility]) 08/31/2014    Past Surgical History:  Procedure Laterality Date  . ABDOMINAL HYSTERECTOMY    . CORONARY ARTERY BYPASS GRAFT    . JOINT REPLACEMENT      Prior to Admission medications   Medication Sig Start Date End Date Taking? Authorizing Provider  acetaminophen (TYLENOL) 325 MG tablet Take 650 mg by mouth every 4 (four) hours as needed for mild pain or fever.    Historical Provider, MD  acetaminophen (TYLENOL) 500 MG tablet Take 500 mg by mouth daily.    Historical Provider, MD  ALPRAZolam Duanne Moron) 0.25 MG tablet Take 0.25 mg by mouth 2 (two) times daily as needed for anxiety.    Historical Provider, MD  amLODipine (NORVASC) 5 MG tablet Take 5 mg by mouth daily.    Historical Provider, MD  ARTIFICIAL TEAR OP Apply 1 drop to eye 4 (four)  times daily as needed (for dry eyes).    Historical Provider, MD  aspirin EC 325 MG tablet Take 325 mg by mouth daily.    Historical Provider, MD  atorvastatin (LIPITOR) 40 MG tablet Take 1 tablet (40 mg total) by mouth daily at 6 PM. 09/01/14   Bettey Costa, MD  bisacodyl (DULCOLAX) 10 MG suppository Place 10 mg rectally as needed for moderate constipation.    Historical Provider, MD  buPROPion (WELLBUTRIN XL) 150 MG 24 hr tablet Take 150 mg by mouth daily.    Historical Provider, MD  buPROPion (WELLBUTRIN XL) 300 MG 24 hr tablet  01/28/16   Historical Provider, MD  Diphenhyd-Hydrocort-Nystatin (FIRST-DUKES MOUTHWASH) SUSP Take 5 mLs by mouth 3 (three) times daily as needed (for sore throat). Pt swishes and spits.    Historical Provider, MD  divalproex (DEPAKOTE) 500 MG DR tablet Take 500 mg by mouth 2 (two) times daily.    Historical Provider, MD  enalapril (VASOTEC) 2.5 MG tablet Take 2.5 mg by mouth daily.    Historical Provider, MD  fentaNYL (DURAGESIC - DOSED MCG/HR) 12 MCG/HR Place 12.5 mcg onto the skin every 3 (three) days.    Historical Provider, MD  furosemide (LASIX) 80 MG tablet Take 80 mg by mouth 2 (two) times daily.    Historical Provider, MD  HYDROcodone-acetaminophen (NORCO/VICODIN) 5-325 MG per tablet Take 1 tablet by mouth every 4 (four)  hours as needed for moderate pain.     Historical Provider, MD  insulin glargine (LANTUS) 100 UNIT/ML injection Inject 15-60 Units into the skin 2 (two) times daily. Pt uses 15 units in the morning and 60 units every evening.    Historical Provider, MD  isosorbide mononitrate (IMDUR) 30 MG 24 hr tablet Take 30 mg by mouth daily.    Historical Provider, MD  levothyroxine (SYNTHROID, LEVOTHROID) 75 MCG tablet Take 75 mcg by mouth daily before breakfast.    Historical Provider, MD  metoprolol succinate (TOPROL-XL) 50 MG 24 hr tablet Take 50 mg by mouth daily.    Historical Provider, MD  polyethylene glycol (MIRALAX / GLYCOLAX) packet Take 17 g by mouth at  bedtime.    Historical Provider, MD  polyethylene glycol powder (GLYCOLAX/MIRALAX) powder  01/21/16   Historical Provider, MD  potassium chloride (K-DUR) 10 MEQ tablet Take 10 mEq by mouth daily.    Historical Provider, MD  senna (SENOKOT) 8.6 MG tablet Take 1 tablet by mouth 2 (two) times daily.    Historical Provider, MD  thiothixene (NAVANE) 2 MG capsule Take 2 mg by mouth 2 (two) times daily.    Historical Provider, MD  tiotropium (SPIRIVA) 18 MCG inhalation capsule Place 18 mcg into inhaler and inhale daily.    Historical Provider, MD  traZODone (DESYREL) 50 MG tablet Take 25 mg by mouth at bedtime as needed for sleep.    Historical Provider, MD  venlafaxine XR (EFFEXOR-XR) 150 MG 24 hr capsule Take 150 mg by mouth at bedtime.    Historical Provider, MD    Allergies  Allergen Reactions  . Codeine Other (See Comments)    Reaction:  Unknown   . Levaquin [Levofloxacin In D5w] Other (See Comments)    Reaction:  Unknown   . Risperdal [Risperidone] Other (See Comments)    Reaction:  Unknown   . Sulfa Antibiotics Other (See Comments)    Reaction:  Unknown     Family History  Problem Relation Age of Onset  . Heart attack Father   . Diabetes Father   . Diabetes Brother     Social History Social History  Substance Use Topics  . Smoking status: Former Research scientist (life sciences)  . Smokeless tobacco: Not on file  . Alcohol use No    Review of Systems Constitutional: Negative for fever. Cardiovascular: Negative for chest pain. Respiratory: Negative for shortness of breath. Gastrointestinal: Negative for abdominal pain. Positive for vomiting this morning. Negative for diarrhea Neurological: Negative for headache 10-point ROS otherwise negative.  ____________________________________________   PHYSICAL EXAM:  VITAL SIGNS: ED Triage Vitals [04/06/16 0735]  Enc Vitals Group     BP (!) 218/66     Pulse Rate 73     Resp 19     Temp 99.6 F (37.6 C)     Temp Source Oral     SpO2 98 %      Weight 225 lb (102.1 kg)     Height 5\' 8"  (1.727 m)     Head Circumference      Peak Flow      Pain Score      Pain Loc      Pain Edu?      Excl. in [place]?     Constitutional: Patient is quite somnolent. Will awaken to voice, will answer questions but then quickly falls back asleep. Eyes: Normal exam ENT   Head: Normocephalic and atraumatic.   Mouth/Throat: Mucous membranes are moist. Cardiovascular: Normal rate, regular rhythm.  No murmur Respiratory: Normal respiratory effort without tachypnea nor retractions. Breath sounds are clear  Gastrointestinal: Soft and nontender. No distention.   Musculoskeletal: Nontender with normal range of motion in all extremities.  Neurologic:  Patient will open mouth, she speaks will answer questions but falls back asleep quickly. Patient has past history of CVA, is unclear if there are any new deficits at this time as the patient cannot cooperate enough with neurological examination. Skin:  Skin is warm, dry and intact.  Psychiatric: Quite somnolent.  ____________________________________________    EKG  EKG reviewed and interpreted by myself shows normal sinus rhythm at 76 bpm, widened QRS with left axis deviation, nonspecific ST changes most consistent with left bundle branch block.  ____________________________________________   INITIAL IMPRESSION / ASSESSMENT AND PLAN / ED COURSE  Pertinent labs & imaging results that were available during my care of the patient were reviewed by me and considered in my medical decision making (see chart for details).  Patient presented to the emergency department with nausea and vomiting, from her nursing facility this morning. Here the patient is very somnolent, low-grade fever 99.6, vitals are otherwise normal besides hypertension. We will check labs, IV hydrate, check an EKG and closely monitor.  Patient's labs show significant leukocytosis of 21,000, troponin 0.17. Slight creatinine elevation of  2.2.  Patient's urinalysis has resulted consistent with a significant urinary tract infection which is likely the cause of the patient's vomiting and leukocytosis. We will treat with IV Rocephin, send blood and urine cultures and continue to closely monitor. Patient will be admitted to the hospital for further treatment. ____________________________________________   FINAL CLINICAL IMPRESSION(S) / ED DIAGNOSES  Vomiting Urinary tract infection Altered mental status   Harvest Dark, MD 04/06/16 [phone]

## 2016-04-06 NOTE — ED Notes (Signed)
Admitting MD in room to assess patient.  Will continue to monitor.   

## 2016-04-06 NOTE — Telephone Encounter (Signed)
Per chart review tab pt is at [facility] ED. 

## 2016-04-06 NOTE — Telephone Encounter (Signed)
PLEASE NOTE: All timestamps contained within this report are represented as [place] Standard Time. CONFIDENTIALTY NOTICE: This fax transmission is intended only for the addressee. It contains information that is legally privileged, confidential or otherwise protected from use or disclosure. If you are not the intended recipient, you are strictly prohibited from reviewing, disclosing, copying using or disseminating any of this information or taking any action in reliance on or regarding this information. If you have received this fax in error, please notify us immediately by telephone so that we can arrange for its return to [place]. Phone: [phone], Toll-Free: [phone], Fax: [phone] Page: 1 of 1 Call Id: [id number] [place] Night - Client Nonclinical Telephone Record [place] Night - Client Client Site [place] Physician Viviana Simpler - MD Contact Type Call Who Is Calling Physician / Provider / Hospital Call Type Provider Call [facility] Page Now Reason for Call Request to speak to Physician Initial Comment Caller states, [facility] [place] needing to speak to on-call order for rx. - vomiting. Additional Comment Patient Name Susan Graham Patient DOB 1945-05-31 Requesting Provider [facility] Physician Number [phone] Facility Name [facility] Phone DateTime Result/Outcome Message Type Notes Thersa Salt - DO [phone] 04/06/2016 6:44:26 AM Paged On Call Back to Call Center Doctor Paged Thersa Salt - DO [phone] 04/06/2016 6:55:24 AM Paged On Call Back to Call Center Doctor Paged Thersa Salt - DO 04/06/2016 7:05:04 AM Spoke with On Call - General Message Result On call returned page. Information provided and transferred to caller. Call Closed By: Walton Lions Transaction Date/Time: 04/06/2016 6:35:57 AM (ET)

## 2016-04-06 NOTE — ED Triage Notes (Signed)
Patient presents to the ED via EMS from [facility].  Patient has history of stroke, chf, and dementia.  Staff called EMS with reports of patient vomiting x 4.  Denies blood in emesis.  Patient appears lethargic at triage, arousable to touch but not verbal stimuli.  Patient feels warm to the touch.

## 2016-04-06 NOTE — H&P (Signed)
[place] [facility] [place] NAME: Susan Graham    MR#:  [id number]  DATE OF BIRTH:  06/16/1945  DATE OF ADMISSION:  04/06/2016  PRIMARY CARE PHYSICIAN: Arnette Norris, MD   REQUESTING/REFERRING PHYSICIAN: Harvest Dark, MD  CHIEF COMPLAINT:   Chief Complaint  Patient presents with  . Emesis   Emesis HISTORY OF PRESENT ILLNESS:  Susan Graham  is a 71 y.o. female with a known history of Acute on chronic respiratory failure, COPD, CHF, CKD, CAD, HTN, DM and CVA. The patient was sent to ED due to emesis. She is confused and bed bound at baseline. CXR: PNA, UA: UTI. WBC 21,000.   PAST MEDICAL HISTORY:   Past Medical History:  Diagnosis Date  . Acute on chronic respiratory failure ([place])   . Anxiety   . Breast cancer ([place])   . CHF (congestive heart failure) ([place])   . Chronic kidney disease   . COPD (chronic obstructive pulmonary disease) ([place])   . Coronary artery disease   . Diabetes mellitus without complication ([place])   . Hypertension   . Stroke [facility])     PAST SURGICAL HISTORY:   Past Surgical History:  Procedure Laterality Date  . ABDOMINAL HYSTERECTOMY    . CORONARY ARTERY BYPASS GRAFT    . JOINT REPLACEMENT      SOCIAL HISTORY:   Social History  Substance Use Topics  . Smoking status: Former Research scientist (life sciences)  . Smokeless tobacco: Never Used  . Alcohol use No    FAMILY HISTORY:   Family History  Problem Relation Age of Onset  . Heart attack Father   . Diabetes Father   . Diabetes Brother     DRUG ALLERGIES:   Allergies  Allergen Reactions  . Codeine Other (See Comments)    Reaction:  Unknown   . Levaquin [Levofloxacin In D5w] Other (See Comments)    Reaction:  Unknown   . Risperdal [Risperidone] Other (See Comments)    Reaction:  Unknown   . Sulfa Antibiotics Other (See Comments)    Reaction:  Unknown     REVIEW OF SYSTEMS:   Review of Systems  Unable to perform ROS: Mental status change    MEDICATIONS AT  HOME:   Prior to Admission medications   Medication Sig Start Date End Date Taking? Authorizing Provider  acetaminophen (TYLENOL) 325 MG tablet Take 650 mg by mouth every 4 (four) hours as needed for mild pain or fever.   Yes Historical Provider, MD  acetaminophen (TYLENOL) 500 MG tablet Take 500 mg by mouth daily.   Yes Historical Provider, MD  ALPRAZolam (XANAX) 0.25 MG tablet Take 0.25 mg by mouth 2 (two) times daily as needed for anxiety.   Yes Historical Provider, MD  amLODipine (NORVASC) 5 MG tablet Take 5 mg by mouth daily.   Yes Historical Provider, MD  ARTIFICIAL TEAR OP Apply 1 drop to eye 4 (four) times daily as needed (for dry eyes).   Yes Historical Provider, MD  aspirin EC 325 MG tablet Take 325 mg by mouth daily.   Yes Historical Provider, MD  atorvastatin (LIPITOR) 40 MG tablet Take 1 tablet (40 mg total) by mouth daily at 6 PM. 09/01/14  Yes Sital Mody, MD  bisacodyl (DULCOLAX) 10 MG suppository Place 10 mg rectally as needed for moderate constipation.   Yes Historical Provider, MD  buPROPion (WELLBUTRIN XL) 150 MG 24 hr tablet Take 150 mg by mouth daily.   Yes Historical Provider, MD  Diphenhyd-Hydrocort-Nystatin (FIRST-DUKES MOUTHWASH) SUSP Take 5 mLs by mouth 3 (three) times daily as needed (for sore throat). Pt swishes and spits.   Yes Historical Provider, MD  divalproex (DEPAKOTE) 500 MG DR tablet Take 500 mg by mouth 2 (two) times daily.   Yes Historical Provider, MD  enalapril (VASOTEC) 2.5 MG tablet Take 2.5 mg by mouth daily.   Yes Historical Provider, MD  furosemide (LASIX) 80 MG tablet Take 80 mg by mouth 2 (two) times daily.   Yes Historical Provider, MD  insulin glargine (LANTUS) 100 UNIT/ML injection Inject 15-50 Units into the skin 2 (two) times daily. Pt uses 15 units in the morning and 50 units every evening.   Yes Historical Provider, MD  isosorbide mononitrate (IMDUR) 30 MG 24 hr tablet Take 30 mg by mouth daily.   Yes Historical Provider, MD  levothyroxine  (SYNTHROID, LEVOTHROID) 75 MCG tablet Take 75 mcg by mouth every evening.    Yes Historical Provider, MD  metoprolol succinate (TOPROL-XL) 50 MG 24 hr tablet Take 50 mg by mouth daily.   Yes Historical Provider, MD  polyethylene glycol powder (GLYCOLAX/MIRALAX) powder Take 17 g by mouth daily.  01/21/16  Yes Historical Provider, MD  potassium chloride (K-DUR) 10 MEQ tablet Take 10 mEq by mouth daily.   Yes Historical Provider, MD  senna (SENOKOT) 8.6 MG tablet Take 1 tablet by mouth 2 (two) times daily.   Yes Historical Provider, MD  thiothixene (NAVANE) 2 MG capsule Take 4 mg by mouth 2 (two) times daily.    Yes Historical Provider, MD  traMADol (ULTRAM) 50 MG tablet Take 50 mg by mouth 3 (three) times daily.   Yes Historical Provider, MD  traZODone (DESYREL) 50 MG tablet Take 25 mg by mouth at bedtime as needed for sleep.   Yes Historical Provider, MD  triamcinolone cream (KENALOG) 0.1 % Apply 1 application topically 2 (two) times daily as needed. To rash on eyebrows/ chin 01/07/16  Yes Historical Provider, MD  venlafaxine XR (EFFEXOR-XR) 150 MG 24 hr capsule Take 150 mg by mouth at bedtime.   Yes Historical Provider, MD  fentaNYL (DURAGESIC - DOSED MCG/HR) 12 MCG/HR Place 12.5 mcg onto the skin every 3 (three) days.    Historical Provider, MD  HYDROcodone-acetaminophen (NORCO/VICODIN) 5-325 MG per tablet Take 1 tablet by mouth every 4 (four) hours as needed for moderate pain.     Historical Provider, MD  tiotropium (SPIRIVA) 18 MCG inhalation capsule Place 18 mcg into inhaler and inhale daily.    Historical Provider, MD      VITAL SIGNS:  Blood pressure (!) 167/68, pulse 72, temperature 99.6 F (37.6 C), temperature source Oral, resp. rate 18, height 5\' 8"  (1.727 m), weight 225 lb (102.1 kg), SpO2 100 %.  PHYSICAL EXAMINATION:  Physical Exam  Constitutional: She is well-developed, well-nourished, and in no distress.  HENT:  Mouth/Throat: Oropharynx is clear and moist.  Eyes: Conjunctivae  and EOM are normal.  Neck: Normal range of motion. Neck supple. No JVD present. No tracheal deviation present.  Cardiovascular: Normal rate, regular rhythm and normal heart sounds.  Exam reveals no gallop.   No murmur heard. Pulmonary/Chest: Effort normal and breath sounds normal. No respiratory distress. She has no wheezes. She has no rales.  Abdominal: Soft. Bowel sounds are normal. She exhibits no distension. There is no tenderness.  Musculoskeletal: She exhibits no edema or tenderness.  Neurological:  Confused, unable to exam.  Skin: No rash noted. No erythema.  Psychiatric:  confused.  LABORATORY PANEL:   CBC  Recent Labs Lab 04/06/16 0750  WBC 21.0*  HGB 10.5*  HCT 31.9*  PLT 183   ------------------------------------------------------------------------------------------------------------------  Chemistries   Recent Labs Lab 04/06/16 0750  NA 133*  K 4.1  CL 98*  CO2 26  GLUCOSE 112*  BUN 52*  CREATININE 2.21*  CALCIUM 9.3  AST 11*  ALT 7*  ALKPHOS 54  BILITOT 0.6   ------------------------------------------------------------------------------------------------------------------  Cardiac Enzymes  Recent Labs Lab 04/06/16 0750  TROPONINI 0.17*   ------------------------------------------------------------------------------------------------------------------  RADIOLOGY:  Dg Chest Portable 1 View  Result Date: 04/06/2016 CLINICAL DATA:  Altered mental status.  Vomiting. EXAM: PORTABLE CHEST 1 VIEW COMPARISON:  August 31, 2014 FINDINGS: There is patchy airspace consolidation in the left lower lobe with small left pleural effusion. Right lung is clear. There is cardiomegaly with pulmonary venous hypertension. There is atherosclerotic calcification in the aorta. Patient is status post coronary artery bypass grafting. No adenopathy. No bone lesions. There are foci of atherosclerotic calcification in each carotid artery. IMPRESSION: Left lower lobe airspace  consolidation, likely pneumonia, with small left pleural effusion. Right lung clear. There is pulmonary vascular congestion. There is aortic atherosclerosis as well as calcification in each carotid artery. Electronically Signed   By: Lowella Grip III M.D.   On: 04/06/2016 09:02      IMPRESSION AND PLAN:   Sepsis due to HAP and UTI. Admit to medical floor. Start sepsis protocol, cefepime an vancomycin, follow up CBC and cultures. ARF on CKD, start NS iv, follow up BMP. Hold enalapril. Acute metabolic encephalopathy due to above. Aspiration and fall precaution. HTN malignancy. Hydralazine iv prn. Continue home HTN medication. Breast cancer, on hospice care. COPD. Stable. NEB. Chronic diastolic CHF. Stable. Hold lasix due to ARF on CKD. All the records are reviewed and case discussed with ED provider. Management plans discussed with the patient, family and they are in agreement.  CODE STATUS: DNR  TOTAL TIME TAKING CARE OF THIS PATIENT: 58 minutes.    Demetrios Loll M.D on 04/06/2016 at 10:24 AM  Between 7am to 6pm - Pager - [phone]  After 6pm go to [email or web address] - Proofreader  [facility]  Office  [phone]  CC: Primary care physician; Arnette Norris, MD   Note: This dictation was prepared with Dragon dictation along with smaller phrase technology. Any transcriptional errors that result from this process are unintentional.

## 2016-04-06 NOTE — ED Notes (Signed)
Assisted Susan Graham with in and out cath.

## 2016-04-06 NOTE — NC FL2 (Signed)
[place] LEVEL OF CARE SCREENING TOOL     IDENTIFICATION  Patient Name: Susan Graham Birthdate: 02/01/1945 Sex: female Admission Date (Current Location): 04/06/2016  [employer] and [place] Number:  Engineering geologist and Address:  [facility], [address] [place], [place] [address]      Provider Number: [id number]  Attending Physician Name and Address:  Demetrios Loll, MD  Relative Name and Phone Number:       Current Level of Care: Hospital Recommended Level of Care: [place] Prior Approval Number:    Date Approved/Denied:   PASRR Number: [id number]  Discharge Plan: SNF    Current Diagnoses: Patient Active Problem List   Diagnosis Date Noted  . Sepsis ([place]) 04/06/2016  . Breast mass, right 02/12/2016  . Pressure ulcer 09/01/2014  . Angina at rest [facility]) 08/31/2014    Orientation RESPIRATION BLADDER Height & Weight     Self  Normal Incontinent Weight: 203 lb 11.2 oz (92.4 kg) Height:  5\' 4"  (162.6 cm)  BEHAVIORAL SYMPTOMS/MOOD NEUROLOGICAL BOWEL NUTRITION STATUS   (none)  (none) Incontinent Diet (heart healthy)  AMBULATORY STATUS COMMUNICATION OF NEEDS Skin   Extensive Assist Verbally Normal                       Personal Care Assistance Level of Assistance  Bathing, Dressing Bathing Assistance: Maximum assistance   Dressing Assistance: Maximum assistance     Functional Limitations Info   (no issues)          SPECIAL CARE FACTORS FREQUENCY                       Contractures Contractures Info: Not present    Additional Factors Info  Code Status Code Status Info: dnr             Current Medications (04/06/2016):  This is the current hospital active medication list Current Facility-Administered Medications  Medication Dose Route Frequency Provider Last Rate Last Dose  . 0.9 %  sodium chloride infusion   Intravenous Continuous Demetrios Loll, MD 75 mL/hr at 04/06/16 1157    .  acetaminophen (TYLENOL) tablet 650 mg  650 mg Oral Q6H PRN Demetrios Loll, MD       Or  . acetaminophen (TYLENOL) suppository 650 mg  650 mg Rectal Q6H PRN Demetrios Loll, MD      . albuterol (PROVENTIL) (2.5 MG/3ML) 0.083% nebulizer solution 2.5 mg  2.5 mg Nebulization Q2H PRN Demetrios Loll, MD      . ALPRAZolam Duanne Moron) tablet 0.25 mg  0.25 mg Oral BID PRN Demetrios Loll, MD      . amLODipine (NORVASC) tablet 5 mg  5 mg Oral Daily Demetrios Loll, MD   5 mg at 04/06/16 1249  . aspirin EC tablet 325 mg  325 mg Oral Daily Demetrios Loll, MD   325 mg at 04/06/16 1252  . atorvastatin (LIPITOR) tablet 40 mg  40 mg Oral q1800 Demetrios Loll, MD      . bisacodyl (DULCOLAX) EC tablet 5 mg  5 mg Oral Daily PRN Demetrios Loll, MD      . bisacodyl (DULCOLAX) suppository 10 mg  10 mg Rectal PRN Demetrios Loll, MD      . buPROPion (WELLBUTRIN XL) 24 hr tablet 150 mg  150 mg Oral Daily Demetrios Loll, MD   150 mg at 04/06/16 1252  . ceFEPIme (MAXIPIME) 2 g in dextrose 5 % 50 mL  IVPB  2 g Intravenous Q24H Demetrios Loll, MD   2 g at 04/06/16 1221  . divalproex (DEPAKOTE) DR tablet 500 mg  500 mg Oral BID Demetrios Loll, MD   500 mg at 04/06/16 1252  . guaiFENesin-dextromethorphan (ROBITUSSIN DM) 100-10 MG/5ML syrup 5 mL  5 mL Oral Q4H PRN Demetrios Loll, MD      . heparin injection 5,000 Units  5,000 Units Subcutaneous Q8H Demetrios Loll, MD      . hydrALAZINE (APRESOLINE) injection 10 mg  10 mg Intravenous Q6H PRN Demetrios Loll, MD      . HYDROcodone-acetaminophen (NORCO/VICODIN) 5-325 MG per tablet 1 tablet  1 tablet Oral Q4H PRN Demetrios Loll, MD      . insulin aspart (novoLOG) injection 0-5 Units  0-5 Units Subcutaneous QHS Demetrios Loll, MD      . insulin aspart (novoLOG) injection 0-9 Units  0-9 Units Subcutaneous TID WC Demetrios Loll, MD      . isosorbide mononitrate (IMDUR) 24 hr tablet 30 mg  30 mg Oral Daily Demetrios Loll, MD   30 mg at 04/06/16 1250  . levothyroxine (SYNTHROID, LEVOTHROID) tablet 75 mcg  75 mcg Oral QPM Demetrios Loll, MD      . magic mouthwash  5 mL Oral TID PRN Demetrios Loll,  MD      . MEDLINE mouth rinse  15 mL Mouth Rinse BID Demetrios Loll, MD      . metoprolol succinate (TOPROL-XL) 24 hr tablet 50 mg  50 mg Oral Daily Demetrios Loll, MD   50 mg at 04/06/16 1250  . ondansetron (ZOFRAN) tablet 4 mg  4 mg Oral Q6H PRN Demetrios Loll, MD       Or  . ondansetron [facility]) injection 4 mg  4 mg Intravenous Q6H PRN Demetrios Loll, MD      . polyethylene glycol (MIRALAX / GLYCOLAX) packet 17 g  17 g Oral Daily Demetrios Loll, MD   17 g at 04/06/16 1249  . potassium chloride (K-DUR) CR tablet 10 mEq  10 mEq Oral Daily Demetrios Loll, MD   10 mEq at 04/06/16 1249  . senna (SENOKOT) tablet 8.6 mg  1 tablet Oral BID Demetrios Loll, MD   8.6 mg at 04/06/16 1252  . senna-docusate (Senokot-S) tablet 1 tablet  1 tablet Oral QHS PRN Demetrios Loll, MD      . sodium chloride flush (NS) 0.9 % injection 3 mL  3 mL Intravenous Q12H Demetrios Loll, MD      . thiothixene (NAVANE) capsule 4 mg  4 mg Oral BID Demetrios Loll, MD   4 mg at 04/06/16 1249  . tiotropium (SPIRIVA) inhalation capsule 18 mcg  18 mcg Inhalation Daily Demetrios Loll, MD   18 mcg at 04/06/16 1256  . traMADol (ULTRAM) tablet 50 mg  50 mg Oral TID Demetrios Loll, MD   50 mg at 04/06/16 1252  . traZODone (DESYREL) tablet 25 mg  25 mg Oral QHS PRN Demetrios Loll, MD      . vancomycin (VANCOCIN) 1,250 mg in sodium chloride 0.9 % 250 mL IVPB  1,250 mg Intravenous Q36H Demetrios Loll, MD      . vancomycin (VANCOCIN) IVPB 1000 mg/200 mL premix  1,000 mg Intravenous Once Demetrios Loll, MD      . venlafaxine XR (EFFEXOR-XR) 24 hr capsule 150 mg  150 mg Oral QHS Demetrios Loll, MD         Discharge Medications: Please see discharge summary for a list of discharge medications.  Relevant Imaging Results:  Relevant Lab Results:   Additional Information    Shela Leff, LCSW

## 2016-04-06 NOTE — ED Notes (Signed)
Hooked pt up to the monitor.

## 2016-04-06 NOTE — Telephone Encounter (Signed)
Will await disposition in ER

## 2016-04-06 NOTE — Progress Notes (Signed)
Pharmacy Antibiotic Note  Susan Graham is a 71 y.o. female admitted on 04/06/2016 with pneumonia/UTI.  Pharmacy has been consulted for Cefepime and Vancomycin dosing.  From [facility]   Plan: -Patient to receive Vancomycin 1 gram IV x 1. Will continue with Stacked dosing and give Vancomycin 1250 mg IV Q36h Goal trough= 15-20 mcg/ml. Ke 0.024,  T1/2 28.88, Vd 55.4.  AdjBW= 79.2 kg Will schedule Vanc trough 3/15 at 2230.  -Cefepime 2 gram IV q24h for Crcl 29.6 ml/min  F/u MRSA PCR. If negative would recommend discontinuing Vancomycin. F/u Renal fxn for dose changes    Height: 5\' 8"  (172.7 cm) Weight: 225 lb (102.1 kg) IBW/kg (Calculated) : 63.9  Temp (24hrs), Avg:99.1 F (37.3 C), Min:98.6 F (37 C), Max:99.6 F (37.6 C)   Recent Labs Lab 04/06/16 0750  WBC 21.0*  CREATININE 2.21*    Estimated Creatinine Clearance: 29.6 mL/min (by C-G formula based on SCr of 2.21 mg/dL (H)).    Allergies  Allergen Reactions  . Codeine Other (See Comments)    Reaction:  Unknown   . Levaquin [Levofloxacin In D5w] Other (See Comments)    Reaction:  Unknown   . Risperdal [Risperidone] Other (See Comments)    Reaction:  Unknown   . Sulfa Antibiotics Other (See Comments)    Reaction:  Unknown     Antimicrobials this admission: Cefepime 3/12 >>   Vanc  3/12 >>   CTX x 1 3/12  Dose adjustments this admission:    Microbiology results: 3/12 BCx: pending 3/12 UCx: pending    Sputum:    3/12 MRSA PCR: pending  Thank you for allowing pharmacy to be a part of this patient's care.  Alyria Krack A 04/06/2016 11:33 AM

## 2016-04-07 LAB — GLUCOSE, CAPILLARY
GLUCOSE-CAPILLARY: 138 mg/dL — AB (ref 65–99)
GLUCOSE-CAPILLARY: 167 mg/dL — AB (ref 65–99)
Glucose-Capillary: 134 mg/dL — ABNORMAL HIGH (ref 65–99)
Glucose-Capillary: 173 mg/dL — ABNORMAL HIGH (ref 65–99)

## 2016-04-07 LAB — CBC
HCT: 30.1 % — ABNORMAL LOW (ref 35.0–47.0)
Hemoglobin: 9.9 g/dL — ABNORMAL LOW (ref 12.0–16.0)
MCH: 27.7 pg (ref 26.0–34.0)
MCHC: 32.8 g/dL (ref 32.0–36.0)
MCV: 84.4 fL (ref 80.0–100.0)
PLATELETS: 177 10*3/uL (ref 150–440)
RBC: 3.56 MIL/uL — AB (ref 3.80–5.20)
RDW: 15.3 % — ABNORMAL HIGH (ref 11.5–14.5)
WBC: 17.9 10*3/uL — ABNORMAL HIGH (ref 3.6–11.0)

## 2016-04-07 LAB — BASIC METABOLIC PANEL
Anion gap: 9 (ref 5–15)
BUN: 45 mg/dL — ABNORMAL HIGH (ref 6–20)
CALCIUM: 8.9 mg/dL (ref 8.9–10.3)
CO2: 24 mmol/L (ref 22–32)
CREATININE: 1.59 mg/dL — AB (ref 0.44–1.00)
Chloride: 101 mmol/L (ref 101–111)
GFR calc Af Amer: 37 mL/min — ABNORMAL LOW (ref >60)
GFR calc non Af Amer: 32 mL/min — ABNORMAL LOW (ref >60)
Glucose, Bld: 140 mg/dL — ABNORMAL HIGH (ref 65–99)
Potassium: 4.3 mmol/L (ref 3.5–5.1)
SODIUM: 134 mmol/L — AB (ref 135–145)

## 2016-04-07 MED ORDER — DEXTROSE 5 % IV SOLN
2.0000 g | Freq: Two times a day (BID) | INTRAVENOUS | Status: DC
  Administered 2016-04-07 – 2016-04-09 (×6): 2 g via INTRAVENOUS
  Filled 2016-04-07 (×8): qty 2

## 2016-04-07 MED ORDER — AMLODIPINE BESYLATE 5 MG PO TABS
5.0000 mg | ORAL_TABLET | Freq: Once | ORAL | Status: AC
  Administered 2016-04-07: 5 mg via ORAL
  Filled 2016-04-07: qty 1

## 2016-04-07 MED ORDER — AMLODIPINE BESYLATE 10 MG PO TABS
10.0000 mg | ORAL_TABLET | Freq: Every day | ORAL | Status: DC
  Administered 2016-04-08 – 2016-04-09 (×2): 10 mg via ORAL
  Filled 2016-04-07 (×2): qty 1

## 2016-04-07 NOTE — Progress Notes (Signed)
Patient has exaggerated tremors to LUE with movement in bed (rolling from side to side); spit up small amount of yellow, bile-colored emesis this am; Denies pain or distress at this time; noted face was flushed, but afebrile. Barbaraann Faster, RN 04/07/2016 5:54 AM

## 2016-04-07 NOTE — Progress Notes (Signed)
[place] at [place] NAME: Susan Graham    MR#:  [id number]  DATE OF BIRTH:  01-01-1946  SUBJECTIVE:  CHIEF COMPLAINT:   Chief Complaint  Patient presents with  . Emesis   Pt is awake but confused, on O2 [place] 2L. REVIEW OF SYSTEMS:  Review of Systems  Unable to perform ROS: Mental status change    DRUG ALLERGIES:   Allergies  Allergen Reactions  . Codeine Other (See Comments)    Reaction:  Unknown   . Levaquin [Levofloxacin In D5w] Other (See Comments)    Reaction:  Unknown   . Risperdal [Risperidone] Other (See Comments)    Reaction:  Unknown   . Sulfa Antibiotics Other (See Comments)    Reaction:  Unknown    VITALS:  Blood pressure (!) 165/55, pulse 72, temperature 98.7 F (37.1 C), temperature source Oral, resp. rate 17, height 5\' 4"  (1.626 m), weight 203 lb 11.2 oz (92.4 kg), SpO2 93 %. PHYSICAL EXAMINATION:  Physical Exam  Constitutional: She is well-developed, well-nourished, and in no distress.  HENT:  Head: Normocephalic.  Mouth/Throat: Oropharynx is clear and moist.  Eyes: Conjunctivae and EOM are normal. No scleral icterus.  Neck: Normal range of motion. Neck supple. No JVD present. No tracheal deviation present. No thyromegaly present.  Cardiovascular: Normal rate, regular rhythm and normal heart sounds.  Exam reveals no gallop.   No murmur heard. Pulmonary/Chest: Effort normal. No respiratory distress. She has no wheezes. She has rales.  Abdominal: Soft. Bowel sounds are normal. She exhibits no distension. There is no tenderness.  Musculoskeletal: She exhibits no edema or tenderness.  Neurological: She is alert.  Confused. AAOx2  Skin: No rash noted. No erythema.   LABORATORY PANEL:  Female CBC  Recent Labs Lab 04/07/16 0338  WBC 17.9*  HGB 9.9*  HCT 30.1*  PLT 177   ------------------------------------------------------------------------------------------------------------------ Chemistries    Recent Labs Lab 04/06/16 0750 04/06/16 1133 04/07/16 0338  NA 133*  --  134*  K 4.1  --  4.3  CL 98*  --  101  CO2 26  --  24  GLUCOSE 112*  --  140*  BUN 52*  --  45*  CREATININE 2.21* 2.02* 1.59*  CALCIUM 9.3  --  8.9  MG  --  2.3  --   AST 11*  --   --   ALT 7*  --   --   ALKPHOS 54  --   --   BILITOT 0.6  --   --    RADIOLOGY:  No results found. ASSESSMENT AND PLAN:   Sepsis due to HAP and UTI. On sepsis protocol, cefepime an vancomycin, follow up CBC and cultures.  ARF on CKD, improved to baseline with NS iv. Hold enalapril.  Acute metabolic encephalopathy due to above. Better. Aspiration and fall precaution.  Elevated troponin, due to Demanding ischemia. Continue aspirin and Lipitor.  HTN malignancy. BP better controlled. On Hydralazine iv prn. Continue home HTN medication.  Breast cancer, on hospice care. COPD. Stable. NEB. Chronic diastolic CHF. Stable. Hold lasix due to ARF on CKD.  All the records are reviewed and case discussed with ED provider. Management plans discussed with the patient, family and they are in agreement.  All the records are reviewed and case discussed with Care Management/Social Worker. Management plans discussed with the patient's brothers and they are in agreement.  CODE STATUS: DNR  TOTAL TIME TAKING CARE OF THIS PATIENT: 38 minutes.  More than 50% of the time was spent in counseling/coordination of care: YES  POSSIBLE D/C IN 2-3 DAYS, DEPENDING ON CLINICAL CONDITION.   Demetrios Loll M.D on 04/07/2016 at 3:02 PM  Between 7am to 6pm - Pager - [phone]  After 6pm go to [email or web address] - Proofreader  [facility]  Office  [phone]  CC: Primary care physician; Arnette Norris, MD  Note: This dictation was prepared with Dragon dictation along with smaller phrase technology. Any transcriptional errors that result from this process are unintentional.

## 2016-04-07 NOTE — Clinical Social Work Note (Signed)
Clinical Social Work Assessment  Patient Details  Name: Susan Graham MRN: [id number] Date of Birth: [birth year]  Date of referral:  04/07/16               Reason for consult:  Facility Placement, Discharge Planning                Permission sought to share information with:  Family Supports Permission granted to share information::  Yes, Verbal Permission Granted  Name::     Susan Graham  Relationship::  daughter  Contact Information:  [phone]  Housing/Transportation Living arrangements for the past 2 months:  [place] of Information:  Adult Children Patient Interpreter Needed:  None Criminal Activity/Legal Involvement Pertinent to Current Situation/Hospitalization:  No - Comment as needed Significant Relationships:  Adult Children, Siblings Lives with:  Facility Resident Do you feel safe going back to the place where you live?  Yes Need for family participation in patient care:  Yes (Comment)  Care giving concerns:  No care giving concerns identified.   Social Worker assessment / plan:  CSW was consulted for pt as she was admitted from [facility]. CSW spoke with pt's daughter on the phone as pt was sleeping soundly and pt's brothers were at bedside. CSW introduced herself and explained role of social work. CSW also explained the process of returning to facility at discharge. Per pt's daughter, pt is followed by Hospice and [place]. Pt's daughter is agreeable to pt returning to facility as she has been there for 4 years. Per facility, pt is able to return. CSW provided supportive counseling to pt's daughter around current stressors. CSW will continue to follow.   Employment status:  Retired Forensic scientist:  International aid/development worker ) PT Recommendations:  Not assessed at this time Information / Referral to community resources:  Other (Comment Required) ([place] and [place])  Patient/Family's  Response to care:  Pt's daughter was appreciative of CSW support.   Patient/Family's Understanding of and Emotional Response to Diagnosis, Current Treatment, and Prognosis:  Pt's daughter understands that pt will be returning to LTC at [facility] for continued care with supportive services of Hopsice and [place].   Emotional Assessment Appearance:  Appears stated age Attitude/Demeanor/Rapport:  Other (Sleeping soundly) Affect (typically observed):  Other (Sleeeping soundly) Orientation:  Oriented to Self, Oriented to Place Alcohol / Substance use:  Not Applicable Psych involvement (Current and /or in the community):  No (Comment)  Discharge Needs  Concerns to be addressed:  Adjustment to Illness Readmission within the last 30 days:  No Current discharge risk:  Terminally ill Barriers to Discharge:  Continued Medical Work up   [employer], LCSW 04/07/2016, 10:40 AM

## 2016-04-07 NOTE — Progress Notes (Signed)
Pharmacy Antibiotic Note  Susan Graham is a 71 y.o. female admitted on 04/06/2016 with pneumonia/UTI.  Pharmacy has been consulted for Cefepime and Vancomycin dosing.  Vancomycin discontinued for negative MRSA PCR.  Plan: Will change cefepime to 2 g IV q12h given improvement in renal function.  Height: 5\' 4"  (162.6 cm) Weight: 203 lb 11.2 oz (92.4 kg) IBW/kg (Calculated) : 54.7  Temp (24hrs), Avg:98.2 F (36.8 C), Min:97.7 F (36.5 C), Max:98.6 F (37 C)   Recent Labs Lab 04/06/16 0750 04/06/16 1133 04/06/16 1323 04/07/16 0338  WBC 21.0*  --   --  17.9*  CREATININE 2.21* 2.02*  --  1.59*  LATICACIDVEN  --  0.6 1.1  --     Estimated Creatinine Clearance: 36.3 mL/min (by C-G formula based on SCr of 1.59 mg/dL (H)).    Allergies  Allergen Reactions  . Codeine Other (See Comments)    Reaction:  Unknown   . Levaquin [Levofloxacin In D5w] Other (See Comments)    Reaction:  Unknown   . Risperdal [Risperidone] Other (See Comments)    Reaction:  Unknown   . Sulfa Antibiotics Other (See Comments)    Reaction:  Unknown     Antimicrobials this admission: Cefepime 3/12 >>   Vanc  3/12 >>  3/13 CTX x 1 3/12  Dose adjustments this admission:  Microbiology results: 3/12 BCx: No growth < 24 hours 3/12 UCx: Sent  3/12 MRSA PCR: negative  Thank you for allowing pharmacy to be a part of this patient's care.  Lenis Noon, PharmD, BCPS Clinical Pharmacist 04/07/2016 10:06 AM

## 2016-04-07 NOTE — Progress Notes (Signed)
Visit made. Patient is currently followed by Hospice and [place] at [facility] with a diagnosis of Hypertensive heart disease. She is a DNR code. Patient was admitted on 3/12 for treatment of nausea vomiting. Chat notes reviewed, she is currently receiving IV antibiotics and IV fluid for treatment of PNA and UTI with elevated WBC of 21,000. Patient seen sitting up in bed,alert,  longtime boyfriend Jeneen Rinks present and feeding her lunch, patient ate bites. She reported "feeling bad", but could not elaborate, denied pain. She did interact with Probation officer and her boyfriend, she is slow to respond to questions. Denied any current needs. Hospital Care team aware she is followed by hospice. Will continue to follow and update hospice team. Thank you Flo Shanks RN, BNS, [facility] of [place], hospital Liaison [phone] c

## 2016-04-08 ENCOUNTER — Inpatient Hospital Stay
Admit: 2016-04-08 | Discharge: 2016-04-08 | Disposition: A | Discharge status: Home / Self Care | Attending: Internal Medicine | Admitting: Internal Medicine

## 2016-04-08 ENCOUNTER — Inpatient Hospital Stay

## 2016-04-08 LAB — CBC
HEMATOCRIT: 27.5 % — AB (ref 35.0–47.0)
Hemoglobin: 9.2 g/dL — ABNORMAL LOW (ref 12.0–16.0)
MCH: 27.6 pg (ref 26.0–34.0)
MCHC: 33.4 g/dL (ref 32.0–36.0)
MCV: 82.4 fL (ref 80.0–100.0)
PLATELETS: 204 10*3/uL (ref 150–440)
RBC: 3.33 MIL/uL — ABNORMAL LOW (ref 3.80–5.20)
RDW: 15 % — AB (ref 11.5–14.5)
WBC: 17.9 10*3/uL — AB (ref 3.6–11.0)

## 2016-04-08 LAB — URINE CULTURE

## 2016-04-08 LAB — GLUCOSE, CAPILLARY
GLUCOSE-CAPILLARY: 142 mg/dL — AB (ref 65–99)
GLUCOSE-CAPILLARY: 209 mg/dL — AB (ref 65–99)
Glucose-Capillary: 224 mg/dL — ABNORMAL HIGH (ref 65–99)
Glucose-Capillary: 201 mg/dL — ABNORMAL HIGH (ref 65–99)

## 2016-04-08 MED ORDER — FUROSEMIDE 10 MG/ML IJ SOLN
20.0000 mg | Freq: Two times a day (BID) | INTRAMUSCULAR | Status: DC
  Administered 2016-04-08 – 2016-04-10 (×5): 20 mg via INTRAVENOUS
  Filled 2016-04-08 (×5): qty 2

## 2016-04-08 NOTE — Plan of Care (Signed)
Problem: Respiratory: Goal: Ability to maintain adequate ventilation will improve Outcome: Progressing Pt was weaned down to 2L with O2 stats >95%. Pt is tolerating 2L well.

## 2016-04-08 NOTE — Progress Notes (Signed)
Visit made. Patient seen lying in bed, daughter Lynelle Smoke and boyfriend Jeneen Rinks present. Patient alert, able to answer simple questions, reports feeling some better. Appetite fair, does not like th "low sodium " food per Tammy's report. WBC trended down to 17.9 from 21 on admission. She continues on IV antibiotics, IV lasix started today. Per chart note review patient had a fever overnight and had a repeat CXR which showed pulmonary vascular congestion. No discharge date at this time. Will continue to follow and update hospice team. Thank you. Flo Shanks RN, BSN, [facility] of [place], hospital Liaison [phone] c

## 2016-04-08 NOTE — Progress Notes (Signed)
Dr. Marcille Blanco notified of elevated temp,administration of tylenol; O2 sat decrease to 86% on 2lpm, increased to 4lpm; bilateral rales/crackles; acknowledged; Barbaraann Faster, RN; 6:03 AM 04/08/2016

## 2016-04-08 NOTE — Progress Notes (Signed)
Radiology at [facility] called and notified of stat PCXR. Acknowledged. Barbaraann Faster, RN 6:07 AM 04/08/2016

## 2016-04-08 NOTE — Progress Notes (Signed)
[place] [facility] [place] NAME: Tamaka Sawin    MR#:  [id number]  DATE OF BIRTH:  [birth year]  SUBJECTIVE:  CHIEF COMPLAINT:   Chief Complaint  Patient presents with  . Emesis   Pt is awake but confused, on O2 [place] 3 L. REVIEW OF SYSTEMS:  Review of Systems  Unable to perform ROS: Mental status change    DRUG ALLERGIES:   Allergies  Allergen Reactions  . Codeine Other (See Comments)    Reaction:  Unknown   . Levaquin [Levofloxacin In D5w] Other (See Comments)    Reaction:  Unknown   . Risperdal [Risperidone] Other (See Comments)    Reaction:  Unknown   . Sulfa Antibiotics Other (See Comments)    Reaction:  Unknown    VITALS:  Blood pressure (!) 160/64, pulse 67, temperature 98.2 F (36.8 C), temperature source Oral, resp. rate 17, height 5\' 4"  (1.626 m), weight 203 lb 11.2 oz (92.4 kg), SpO2 97 %. PHYSICAL EXAMINATION:  Physical Exam  Constitutional: She is well-developed, well-nourished, and in no distress.  HENT:  Head: Normocephalic.  Mouth/Throat: Oropharynx is clear and moist.  Eyes: Conjunctivae and EOM are normal. No scleral icterus.  Neck: Normal range of motion. Neck supple. No JVD present. No tracheal deviation present. No thyromegaly present.  Cardiovascular: Normal rate, regular rhythm and normal heart sounds.  Exam reveals no gallop.   No murmur heard. Pulmonary/Chest: Effort normal. No respiratory distress. She has no wheezes. She has rales.  Abdominal: Soft. Bowel sounds are normal. She exhibits no distension. There is no tenderness.  Musculoskeletal: She exhibits no edema or tenderness.  Neurological: She is alert.  Confused. AAOx2  Skin: No rash noted. No erythema.   LABORATORY PANEL:  Female CBC  Recent Labs Lab 04/08/16 0436  WBC 17.9*  HGB 9.2*  HCT 27.5*  PLT 204   ------------------------------------------------------------------------------------------------------------------ Chemistries    Recent Labs Lab 04/06/16 0750 04/06/16 1133 04/07/16 0338  NA 133*  --  134*  K 4.1  --  4.3  CL 98*  --  101  CO2 26  --  24  GLUCOSE 112*  --  140*  BUN 52*  --  45*  CREATININE 2.21* 2.02* 1.59*  CALCIUM 9.3  --  8.9  MG  --  2.3  --   AST 11*  --   --   ALT 7*  --   --   ALKPHOS 54  --   --   BILITOT 0.6  --   --    RADIOLOGY:  Dg Chest Port 1 View  Result Date: 04/08/2016 CLINICAL DATA:  Cough with bilateral rales and crackles. EXAM: PORTABLE CHEST 1 VIEW COMPARISON:  04/06/2016 FINDINGS: Postoperative changes in the mediastinum. Shallow inspiration. Cardiac enlargement. Pulmonary vascular congestion with perihilar edema. Probable left pleural effusion. No pneumothorax. Calcification of the aorta. IMPRESSION: Cardiac enlargement with pulmonary vascular congestion and perihilar edema. Small left pleural effusion. Electronically Signed   By: Lucienne Capers M.D.   On: 04/08/2016 06:39   ASSESSMENT AND PLAN:   Sepsis due to HAP and UTI. On sepsis protocol, continue cefepime, Discontinued vancomycin. Still leukocytosis, follow up CBC and cultures.  Acute on chronic diastolic CHF. Start iv lasix bid. Echocardiogram  Acute respiratory failure with hypoxia. Due to above. Continue nebulizer, try to wean off oxygen. Since chest x-ray show pulmonary congestion, start Lasix.  ARF on CKD, improved to baseline with NS iv. Hold enalapril. IV fluid  was discontinued yesterday.  Acute metabolic encephalopathy due to above. Better. Aspiration and fall precaution.  Elevated troponin, due to Demanding ischemia. Continue aspirin and Lipitor.  HTN malignancy. BP better controlled. On Hydralazine iv prn. Continue home HTN medication.  Breast cancer, on hospice care. COPD. Stable. NEB.  All the records are reviewed and case discussed with ED provider. Management plans discussed with the patient, family and they are in agreement.  All the records are reviewed and case discussed  with Care Management/Social Worker. Management plans discussed with the patient's brothers and they are in agreement.  CODE STATUS: DNR  TOTAL TIME TAKING CARE OF THIS PATIENT: 36 minutes.   More than 50% of the time was spent in counseling/coordination of care: YES  POSSIBLE D/C IN 2-3 DAYS, DEPENDING ON CLINICAL CONDITION.   Demetrios Loll M.D on 04/08/2016 at 3:53 PM  Between 7am to 6pm - Pager - [phone]  After 6pm go to [email or web address] - Proofreader  [facility] [place] [facility]  Office  [phone]  CC: Primary care physician; Viviana Simpler, MD  Note: This dictation was prepared with Dragon dictation along with smaller phrase technology. Any transcriptional errors that result from this process are unintentional.

## 2016-04-08 NOTE — Progress Notes (Signed)
Notified by S. Black, NT that patient's temp was 101.2 and her O2 sat. Was 86% on 2lpm; bilateral lungs auscultated with crackles; RT called to add to assessment and given SVN Tx; Tylenol, water and C/DB implemented; Barbaraann Faster, RN 5:50 AM 04/08/2016

## 2016-04-08 NOTE — Progress Notes (Signed)
RT called to room to add to assessment: "rales/crackles bilaterally, sounds more fluid related, not broncho-restricted"; O2 increased to 4lpm via [place]; O2 sat up to 92%. Barbaraann Faster, RN 5:59 AM 04/08/2016

## 2016-04-09 LAB — CBC
HEMATOCRIT: 27.1 % — AB (ref 35.0–47.0)
HEMOGLOBIN: 9 g/dL — AB (ref 12.0–16.0)
MCH: 27.8 pg (ref 26.0–34.0)
MCHC: 33.1 g/dL (ref 32.0–36.0)
MCV: 84.1 fL (ref 80.0–100.0)
Platelets: 214 10*3/uL (ref 150–440)
RBC: 3.22 MIL/uL — AB (ref 3.80–5.20)
RDW: 15.3 % — ABNORMAL HIGH (ref 11.5–14.5)
WBC: 13 10*3/uL — AB (ref 3.6–11.0)

## 2016-04-09 LAB — GLUCOSE, CAPILLARY
GLUCOSE-CAPILLARY: 139 mg/dL — AB (ref 65–99)
GLUCOSE-CAPILLARY: 201 mg/dL — AB (ref 65–99)
GLUCOSE-CAPILLARY: 151 mg/dL — AB (ref 65–99)
Glucose-Capillary: 193 mg/dL — ABNORMAL HIGH (ref 65–99)

## 2016-04-09 LAB — BASIC METABOLIC PANEL
ANION GAP: 8 (ref 5–15)
BUN: 42 mg/dL — ABNORMAL HIGH (ref 6–20)
CALCIUM: 8.9 mg/dL (ref 8.9–10.3)
CHLORIDE: 99 mmol/L — AB (ref 101–111)
CO2: 25 mmol/L (ref 22–32)
Creatinine, Ser: 1.48 mg/dL — ABNORMAL HIGH (ref 0.44–1.00)
GFR calc Af Amer: 40 mL/min — ABNORMAL LOW (ref >60)
GFR calc non Af Amer: 35 mL/min — ABNORMAL LOW (ref >60)
GLUCOSE: 152 mg/dL — AB (ref 65–99)
POTASSIUM: 4.2 mmol/L (ref 3.5–5.1)
Sodium: 132 mmol/L — ABNORMAL LOW (ref 135–145)

## 2016-04-09 NOTE — Progress Notes (Signed)
Visit made. Patient seen lying in bed eyes closed, did open to verbal stimuli and greeted Probation officer. Boyfriend James at bedside, reports she only at bites at lunch, did drink her liquids. WBC trending down, she remains on IV antibiotics and IV lasix, no fever in 24 hours. Oxygen at 2 liters nasal cannula. Chart notes reviewed, patient discussed with staff RN Ander Purpura. No new concerns at this time. Will continue to follow and update hospice team. Thank you. Flo Shanks RN, BSN, [place] [facility] of [place], [facility] [phone] c

## 2016-04-09 NOTE — Progress Notes (Signed)
Patient ID: [name], female   DOB: [birth year], 71 y.o.   MRN: [id number]  [employer] PROGRESS NOTE  Susan Graham LOV:[id number] DOB: 1945/06/04 DOA: 04/06/2016 PCP: Viviana Simpler, MD  HPI/Subjective: Patient states that she feels yucky but cannot elaborate. Permission to speak in front of brother and boyfriend at the bedside. I also spoke with the daughter on the phone.  Objective: Vitals:   04/09/16 1017 04/09/16 1224  BP: (!) 159/60 (!) 160/55  Pulse: 75 68  Resp:    Temp:  98.1 F (36.7 C)    Filed Weights   04/06/16 0735 04/06/16 1142  Weight: 102.1 kg (225 lb) 92.4 kg (203 lb 11.2 oz)    ROS: Review of Systems  Unable to perform ROS: Dementia  Respiratory: Negative for cough and shortness of breath.   Cardiovascular: Negative for chest pain.  Gastrointestinal: Negative for abdominal pain, nausea and vomiting.  Genitourinary: Negative for dysuria.  Musculoskeletal: Negative for joint pain.   Exam: Physical Exam  HENT:  Nose: No mucosal edema.  Mouth/Throat: No oropharyngeal exudate or posterior oropharyngeal edema.  Eyes: Conjunctivae, EOM and lids are normal. Pupils are equal, round, and reactive to light.  Neck: No JVD present. Carotid bruit is not present. No edema present. No thyroid mass and no thyromegaly present.  Cardiovascular: S1 normal and S2 normal.  Exam reveals no gallop.   No murmur heard. Pulses:      Dorsalis pedis pulses are 2+ on the right side, and 2+ on the left side.  Respiratory: No respiratory distress. She has no wheezes. She has no rhonchi. She has no rales.  GI: Soft. Bowel sounds are normal. There is no tenderness.  Musculoskeletal:       Right ankle: She exhibits swelling.       Left ankle: She exhibits swelling.  Lymphadenopathy:    She has no cervical adenopathy.  Neurological: She is alert.  Skin: Skin is warm. Nails show no clubbing.  Some bruises seen on the arms  Psychiatric: She has a normal mood and affect.       Data Reviewed: Basic Metabolic Panel:  Recent Labs Lab 04/06/16 0750 04/06/16 1133 04/07/16 0338 04/09/16 0510  NA 133*  --  134* 132*  K 4.1  --  4.3 4.2  CL 98*  --  101 99*  CO2 26  --  24 25  GLUCOSE 112*  --  140* 152*  BUN 52*  --  45* 42*  CREATININE 2.21* 2.02* 1.59* 1.48*  CALCIUM 9.3  --  8.9 8.9  MG  --  2.3  --   --    Liver Function Tests:  Recent Labs Lab 04/06/16 0750  AST 11*  ALT 7*  ALKPHOS 54  BILITOT 0.6  PROT 7.5  ALBUMIN 2.9*    Recent Labs Lab 04/06/16 0750  LIPASE 11   CBC:  Recent Labs Lab 04/06/16 0750 04/07/16 0338 04/08/16 0436 04/09/16 0510  WBC 21.0* 17.9* 17.9* 13.0*  HGB 10.5* 9.9* 9.2* 9.0*  HCT 31.9* 30.1* 27.5* 27.1*  MCV 83.9 84.4 82.4 84.1  PLT 183 177 204 214   Cardiac Enzymes:  Recent Labs Lab 04/06/16 0750 04/06/16 1323 04/06/16 1855  TROPONINI 0.17* 0.18* 0.12*    CBG:  Recent Labs Lab 04/08/16 1134 04/08/16 1637 04/08/16 2152 04/09/16 0727 04/09/16 1137  GLUCAP 224* 201* 209* 139* 201*    Recent Results (from the past 240 hour(s))  Urine culture     Status: Abnormal  Collection Time: 04/06/16  8:09 AM  Result Value Ref Range Status   Specimen Description URINE, RANDOM  Final   Special Requests NONE  Final   Culture >=100,000 COLONIES/mL KLEBSIELLA PNEUMONIAE (A)  Final   Report Status 04/08/2016 FINAL  Final   Organism ID, Bacteria KLEBSIELLA PNEUMONIAE (A)  Final      Susceptibility   Klebsiella pneumoniae - MIC*    AMPICILLIN >=32 RESISTANT Resistant     CEFAZOLIN <=4 SENSITIVE Sensitive     CEFTRIAXONE <=1 SENSITIVE Sensitive     CIPROFLOXACIN <=0.25 SENSITIVE Sensitive     GENTAMICIN <=1 SENSITIVE Sensitive     IMIPENEM <=0.25 SENSITIVE Sensitive     NITROFURANTOIN 64 INTERMEDIATE Intermediate     TRIMETH/SULFA <=20 SENSITIVE Sensitive     AMPICILLIN/SULBACTAM >=32 RESISTANT Resistant     PIP/TAZO 8 SENSITIVE Sensitive     Extended ESBL NEGATIVE Sensitive     *  >=100,000 COLONIES/mL KLEBSIELLA PNEUMONIAE  Blood culture (routine x 2)     Status: None (Preliminary result)   Collection Time: 04/06/16  9:32 AM  Result Value Ref Range Status   Specimen Description BLOOD LEFT ARM  Final   Special Requests BOTTLES DRAWN AEROBIC AND ANAEROBIC  BCAV  Final   Culture NO GROWTH 3 DAYS  Final   Report Status PENDING  Incomplete  Blood culture (routine x 2)     Status: None (Preliminary result)   Collection Time: 04/06/16  9:33 AM  Result Value Ref Range Status   Specimen Description BLOOD  RIGHT ARM  Final   Special Requests BOTTLES DRAWN AEROBIC AND ANAEROBIC  BCAV  Final   Culture NO GROWTH 3 DAYS  Final   Report Status PENDING  Incomplete  MRSA PCR Screening     Status: None   Collection Time: 04/06/16 11:29 AM  Result Value Ref Range Status   MRSA by PCR NEGATIVE NEGATIVE Final    Comment:        The GeneXpert MRSA Assay (FDA approved for NASAL specimens only), is one component of a comprehensive MRSA colonization surveillance program. It is not intended to diagnose MRSA infection nor to guide or monitor treatment for MRSA infections.      Studies: Dg Chest Port 1 View  Result Date: 04/08/2016 CLINICAL DATA:  Cough with bilateral rales and crackles. EXAM: PORTABLE CHEST 1 VIEW COMPARISON:  04/06/2016 FINDINGS: Postoperative changes in the mediastinum. Shallow inspiration. Cardiac enlargement. Pulmonary vascular congestion with perihilar edema. Probable left pleural effusion. No pneumothorax. Calcification of the aorta. IMPRESSION: Cardiac enlargement with pulmonary vascular congestion and perihilar edema. Small left pleural effusion. Electronically Signed   By: Lucienne Capers M.D.   On: 04/08/2016 06:39    Scheduled Meds: . amLODipine  10 mg Oral Daily  . aspirin EC  325 mg Oral Daily  . atorvastatin  40 mg Oral q1800  . buPROPion  150 mg Oral Daily  . ceFEPime (MAXIPIME) IV  2 g Intravenous Q12H  . divalproex  500 mg Oral BID  .  furosemide  20 mg Intravenous Q12H  . heparin  5,000 Units Subcutaneous Q8H  . insulin aspart  0-5 Units Subcutaneous QHS  . insulin aspart  0-9 Units Subcutaneous TID WC  . isosorbide mononitrate  30 mg Oral Daily  . levothyroxine  75 mcg Oral QPM  . mouth rinse  15 mL Mouth Rinse BID  . metoprolol succinate  50 mg Oral Daily  . polyethylene glycol  17 g Oral Daily  .  potassium chloride  10 mEq Oral Daily  . senna  1 tablet Oral BID  . sodium chloride flush  3 mL Intravenous Q12H  . thiothixene  4 mg Oral BID  . tiotropium  18 mcg Inhalation Daily  . traMADol  50 mg Oral TID  . venlafaxine XR  150 mg Oral QHS    Assessment/Plan:  1. Clinical sepsis. Acute cystitis with hematuria growing Klebsiella. Patient on aggressive antibiotics cefepime. Leukocytosis trending better. Unclear if there is pneumonia or not since 2 chest x-rays did not comments. We'll check another chest x-ray tomorrow. Since that patient had a fever yesterday watch for more day in the hospital at least. 2. Acute respiratory failure with hypoxia. Try to wean off oxygen 3. Acute on chronic diastolic congestive heart failure. Patient on Lasix IV 4. History of breast cancer which the patient does not know about. Followed at hospice at the facility 5. Acute kidney injury on chronic kidney disease stage III. Improved with IV fluids but watch closely with diuresis at this point 6. History of stroke on aspirin and atorvastatin 7. Hypertension on Norvasc and metoprolol 8. Hypothyroidism unspecified on levothyroxine  Code Status:     Code Status Orders        Start     Ordered   04/06/16 1108  Do not attempt resuscitation (DNR)  Continuous    Question Answer Comment  In the event of cardiac or respiratory ARREST Do not call a "code blue"   In the event of cardiac or respiratory ARREST Do not perform Intubation, CPR, defibrillation or ACLS   In the event of cardiac or respiratory ARREST Use medication by any route,  position, wound care, and other measures to relive pain and suffering. May use oxygen, suction and manual treatment of airway obstruction as needed for comfort.      04/06/16 1108    Code Status History    Date Active Date Inactive Code Status Order ID Comments User Context   08/31/2014  8:44 PM 09/01/2014  7:17 PM DNR [id number]  Vaughan Basta, MD Inpatient     Family Communication: Brother and boyfriend at bedside. Daughter on the phone Disposition Plan: Potential backed facility tomorrow if afebrile  Antibiotics:  Cefepime  Time spent: 35 minutes  [place], [place]

## 2016-04-09 NOTE — Progress Notes (Signed)
Inpatient Diabetes Program Recommendations  AACE/ADA: New Consensus Statement on Inpatient Glycemic Control (2015)  Target Ranges:  Prepandial:   less than 140 mg/dL      Peak postprandial:   less than 180 mg/dL (1-2 hours)      Critically ill patients:  140 - 180 mg/dL  Results for Susan Graham, Susan Graham (MRN [id number]) as of 04/09/2016 13:42  Ref. Range 04/08/2016 07:31 04/08/2016 11:34 04/08/2016 16:37 04/08/2016 21:52 04/09/2016 07:27 04/09/2016 11:37  Glucose-Capillary Latest Ref Range: 65 - 99 mg/dL 142 (H) 224 (H) 201 (H) 209 (H) 139 (H) 201 (H)    Review of Glycemic Control  Diabetes history: DM2 Outpatient Diabetes medications: Lantus 15 units QAM, Lantus 50 units QPM Current orders for Inpatient glycemic control: Novolog 0-9 units TID with meals, Novolog 0-5 units QHS  Inpatient Diabetes Program Recommendations: Insulin - Meal Coverage: Please consider ordering Novolog 3 units TID with meals for meal coverage if patient eats at least 50% of meals.  Thanks, Barnie Alderman, RN, MSN, CDE Diabetes Coordinator Inpatient Diabetes Program [phone] (Team Pager from 8am to 5pm)

## 2016-04-10 ENCOUNTER — Inpatient Hospital Stay

## 2016-04-10 LAB — GLUCOSE, CAPILLARY
GLUCOSE-CAPILLARY: 181 mg/dL — AB (ref 65–99)
Glucose-Capillary: 177 mg/dL — ABNORMAL HIGH (ref 65–99)

## 2016-04-10 MED ORDER — ALPRAZOLAM 0.25 MG PO TABS: 0.2500 mg | ORAL_TABLET | Freq: Two times a day (BID) | ORAL | 0 refills | Status: AC | PRN

## 2016-04-10 MED ORDER — AMLODIPINE BESYLATE 10 MG PO TABS
10.0000 mg | ORAL_TABLET | ORAL | Status: AC
  Administered 2016-04-10: 10 mg via ORAL
  Filled 2016-04-10: qty 1

## 2016-04-10 MED ORDER — CEPHALEXIN 500 MG PO CAPS: 500.0000 mg | ORAL_CAPSULE | Freq: Three times a day (TID) | ORAL | 0 refills | Status: AC

## 2016-04-10 MED ORDER — INSULIN ASPART 100 UNIT/ML ~~LOC~~ SOLN: SUBCUTANEOUS | 0 refills | Status: AC

## 2016-04-10 MED ORDER — TRAMADOL HCL 50 MG PO TABS: 50.0000 mg | ORAL_TABLET | Freq: Three times a day (TID) | ORAL | 0 refills | Status: AC

## 2016-04-10 MED ORDER — CEPHALEXIN 500 MG PO CAPS
500.0000 mg | ORAL_CAPSULE | Freq: Three times a day (TID) | ORAL | Status: DC
  Administered 2016-04-10: 500 mg via ORAL
  Filled 2016-04-10: qty 1

## 2016-04-10 MED ORDER — AMLODIPINE BESYLATE 10 MG PO TABS: 10.0000 mg | ORAL_TABLET | Freq: Every day | ORAL | 0 refills | Status: AC

## 2016-04-10 MED ORDER — HYDROCODONE-ACETAMINOPHEN 5-325 MG PO TABS: 1.0000 | ORAL_TABLET | Freq: Four times a day (QID) | ORAL | 0 refills | Status: AC | PRN

## 2016-04-10 MED ORDER — INSULIN GLARGINE 100 UNIT/ML ~~LOC~~ SOLN: 6.0000 [IU] | Freq: Every day | SUBCUTANEOUS | 0 refills | Status: DC

## 2016-04-10 MED ORDER — AMLODIPINE BESYLATE 10 MG PO TABS: 10.0000 mg | ORAL_TABLET | Freq: Every day | ORAL | Status: DC

## 2016-04-10 NOTE — Progress Notes (Signed)
04/10/2016 1:54 PM  Susan Graham to be D/C'd Skilled nursing facility per MD order.  Discussed prescriptions and follow up appointments with the patient. Prescriptions given to patient, medication list explained in detail. Pt verbalized understanding.  Allergies as of 04/10/2016      Reactions   Codeine Other (See Comments)   Reaction:  Unknown    Levaquin [levofloxacin In D5w] Other (See Comments)   Reaction:  Unknown    Risperdal [risperidone] Other (See Comments)   Reaction:  Unknown    Sulfa Antibiotics Other (See Comments)   Reaction:  Unknown       Medication List    STOP taking these medications   fentaNYL 12 MCG/HR Commonly known as:  DURAGESIC - dosed mcg/hr   insulin glargine 100 UNIT/ML injection Commonly known as:  LANTUS     TAKE these medications   acetaminophen 500 MG tablet Commonly known as:  TYLENOL Take 500 mg by mouth daily.   acetaminophen 325 MG tablet Commonly known as:  TYLENOL Take 650 mg by mouth every 4 (four) hours as needed for mild pain or fever.   ALPRAZolam 0.25 MG tablet Commonly known as:  XANAX Take 1 tablet (0.25 mg total) by mouth 2 (two) times daily as needed for anxiety.   amLODipine 10 MG tablet Commonly known as:  NORVASC Take 1 tablet (10 mg total) by mouth daily. What changed:  medication strength  how much to take   ARTIFICIAL TEAR OP Apply 1 drop to eye 4 (four) times daily as needed (for dry eyes).   aspirin EC 325 MG tablet Take 325 mg by mouth daily.   atorvastatin 40 MG tablet Commonly known as:  LIPITOR Take 1 tablet (40 mg total) by mouth daily at 6 PM.   bisacodyl 10 MG suppository Commonly known as:  DULCOLAX Place 10 mg rectally as needed for moderate constipation.   buPROPion 150 MG 24 hr tablet Commonly known as:  WELLBUTRIN XL Take 150 mg by mouth daily.   cephALEXin 500 MG capsule Commonly known as:  KEFLEX Take 1 capsule (500 mg total) by mouth every 8 (eight) hours.   DEPAKOTE 500 MG DR  tablet Generic drug:  divalproex Take 500 mg by mouth 2 (two) times daily.   enalapril 2.5 MG tablet Commonly known as:  VASOTEC Take 2.5 mg by mouth daily.   FIRST-DUKES MOUTHWASH Susp Take 5 mLs by mouth 3 (three) times daily as needed (for sore throat). Pt swishes and spits.   furosemide 80 MG tablet Commonly known as:  LASIX Take 80 mg by mouth 2 (two) times daily.   HYDROcodone-acetaminophen 5-325 MG tablet Commonly known as:  NORCO/VICODIN Take 1 tablet by mouth every 6 (six) hours as needed for moderate pain. What changed:  when to take this   insulin aspart 100 UNIT/ML injection Commonly known as:  novoLOG 3 units subcutaneous injection if eats greater than 50% of meal   isosorbide mononitrate 30 MG 24 hr tablet Commonly known as:  IMDUR Take 30 mg by mouth daily.   levothyroxine 75 MCG tablet Commonly known as:  SYNTHROID, LEVOTHROID Take 75 mcg by mouth every evening.   metoprolol succinate 50 MG 24 hr tablet Commonly known as:  TOPROL-XL Take 50 mg by mouth daily.   polyethylene glycol powder powder Commonly known as:  GLYCOLAX/MIRALAX Take 17 g by mouth daily.   potassium chloride 10 MEQ tablet Commonly known as:  K-DUR Take 10 mEq by mouth daily.   senna  8.6 MG tablet Commonly known as:  SENOKOT Take 1 tablet by mouth 2 (two) times daily.   thiothixene 2 MG capsule Commonly known as:  NAVANE Take 4 mg by mouth 2 (two) times daily.   tiotropium 18 MCG inhalation capsule Commonly known as:  SPIRIVA Place 18 mcg into inhaler and inhale daily.   traMADol 50 MG tablet Commonly known as:  ULTRAM Take 1 tablet (50 mg total) by mouth 3 (three) times daily.   traZODone 50 MG tablet Commonly known as:  DESYREL Take 25 mg by mouth at bedtime as needed for sleep.   triamcinolone cream 0.1 % Commonly known as:  KENALOG Apply 1 application topically 2 (two) times daily as needed. To rash on eyebrows/ chin   venlafaxine XR 150 MG 24 hr  capsule Commonly known as:  EFFEXOR-XR Take 150 mg by mouth at bedtime.       Vitals:   04/10/16 0957 04/10/16 1220  BP: (!) 167/57 (!) 181/51  Pulse: 78 74  Resp: (!) 24 14  Temp: 98.2 F (36.8 C) 98 F (36.7 C)    Skin clean, dry and intact without evidence of skin break down, no evidence of skin tears noted. IV catheter discontinued intact. Site without signs and symptoms of complications. Dressing and pressure applied. Pt denies pain at this time. No complaints noted.  An After Visit Summary was printed and given to the patient. Patient escorted  and D/C via EMS. Dola Argyle

## 2016-04-10 NOTE — Care Management Important Message (Signed)
Important Message  Patient Details  Name: BRYER GOTTSCH MRN: [id number] Date of Birth: Jan 26, 1946   Medicare Important Message Given:  Yes    Beverly Sessions, RN 04/10/2016, 12:26 PM

## 2016-04-10 NOTE — Discharge Summary (Signed)
[facility] NAME: Susan Graham    MR#:  [id number]  DATE OF BIRTH:  1945/02/02  DATE OF ADMISSION:  04/06/2016 ADMITTING PHYSICIAN: Demetrios Loll, MD  DATE OF DISCHARGE: 04/10/2016  PRIMARY CARE PHYSICIAN: Viviana Simpler, MD    ADMISSION DIAGNOSIS:  Urinary tract infection without hematuria, site unspecified [N39.0] Altered mental status, unspecified altered mental status type [R41.82] Vomiting, intractability of vomiting not specified, presence of nausea not specified, unspecified vomiting type [R11.10]  DISCHARGE DIAGNOSIS:  Active Problems:   Sepsis ([place])   SECONDARY DIAGNOSIS:   Past Medical History:  Diagnosis Date  . Acute on chronic respiratory failure ([place])   . Anxiety   . Breast cancer ([place])   . CHF (congestive heart failure) ([place])   . Chronic kidney disease   . COPD (chronic obstructive pulmonary disease) ([place])   . Coronary artery disease   . Diabetes mellitus without complication ([place])   . Hypertension   . Stroke [facility])     HOSPITAL COURSE:   1. Clinical sepsis. Acute cystitis with hematuria growing Klebsiella. Patient on aggressive antibiotics with cefepime. Leukocytosis trending better. Patient was initially thought to have pneumonia but 3 chest x-rays did not comment on this. Change antibiotics to Keflex upon going home for another 5 days. 2. Acute respiratory failure with hypoxia. Continue oxygen supplementation 2 L 3. Acute on chronic diastolic congestive heart failure. Patient was given IV fluids during the hospital course which may have fluid overloaded her. Patient on IV Lasix here. Can go back on her oral Lasix 80 mg twice a day. 4. History of breast cancer which the patient does not know about. Followed at hospice at the facility. Patient is a DO NOT RESUSCITATE. 5. Acute kidney injury on chronic kidney disease stage III. Improved with IV fluid hydration but watch closely with diuresis. 6. History of  stroke on aspirin and atorvastatin 7. Essential hypertension on Norvasc, metoprolol and Lasix 8. Type 2 diabetes mellitus. I don't think the patient needs the Lantus at home. We will just do sliding scale coverage 3 units if she eats greater than 50% of the meals. Check fingersticks daily at bedtime and before every meal. Potentially can restart low-dose Lantus if sugars trend high urine 9. Hypothyroidism unspecified on levothyroxine  DISCHARGE CONDITIONS:   Satisfactory  CONSULTS OBTAINED:   None DRUG ALLERGIES:   Allergies  Allergen Reactions  . Codeine Other (See Comments)    Reaction:  Unknown   . Levaquin [Levofloxacin In D5w] Other (See Comments)    Reaction:  Unknown   . Risperdal [Risperidone] Other (See Comments)    Reaction:  Unknown   . Sulfa Antibiotics Other (See Comments)    Reaction:  Unknown     DISCHARGE MEDICATIONS:   Current Discharge Medication List    START taking these medications   Details  cephALEXin (KEFLEX) 500 MG capsule Take 1 capsule (500 mg total) by mouth every 8 (eight) hours. Qty: 15 capsule, Refills: 0    insulin aspart (NOVOLOG) 100 UNIT/ML injection 3 units subcutaneous injection if eats greater than 50% of meal Qty: 10 mL, Refills: 0      CONTINUE these medications which have CHANGED   Details  ALPRAZolam (XANAX) 0.25 MG tablet Take 1 tablet (0.25 mg total) by mouth 2 (two) times daily as needed for anxiety. Qty: 30 tablet, Refills: 0    amLODipine (NORVASC) 10 MG tablet Take 1 tablet (10 mg total) by mouth daily. Qty:  30 tablet, Refills: 0    HYDROcodone-acetaminophen (NORCO/VICODIN) 5-325 MG tablet Take 1 tablet by mouth every 6 (six) hours as needed for moderate pain. Qty: 12 tablet, Refills: 0    traMADol (ULTRAM) 50 MG tablet Take 1 tablet (50 mg total) by mouth 3 (three) times daily. Qty: 30 tablet, Refills: 0      CONTINUE these medications which have NOT CHANGED   Details  !! acetaminophen (TYLENOL) 325 MG tablet  Take 650 mg by mouth every 4 (four) hours as needed for mild pain or fever.    !! acetaminophen (TYLENOL) 500 MG tablet Take 500 mg by mouth daily.    ARTIFICIAL TEAR OP Apply 1 drop to eye 4 (four) times daily as needed (for dry eyes).    aspirin EC 325 MG tablet Take 325 mg by mouth daily.    atorvastatin (LIPITOR) 40 MG tablet Take 1 tablet (40 mg total) by mouth daily at 6 PM. Qty: 30 tablet, Refills: 0    bisacodyl (DULCOLAX) 10 MG suppository Place 10 mg rectally as needed for moderate constipation.    buPROPion (WELLBUTRIN XL) 150 MG 24 hr tablet Take 150 mg by mouth daily.    Diphenhyd-Hydrocort-Nystatin (FIRST-DUKES MOUTHWASH) SUSP Take 5 mLs by mouth 3 (three) times daily as needed (for sore throat). Pt swishes and spits.    divalproex (DEPAKOTE) 500 MG DR tablet Take 500 mg by mouth 2 (two) times daily.    enalapril (VASOTEC) 2.5 MG tablet Take 2.5 mg by mouth daily.    furosemide (LASIX) 80 MG tablet Take 80 mg by mouth 2 (two) times daily.    isosorbide mononitrate (IMDUR) 30 MG 24 hr tablet Take 30 mg by mouth daily.    levothyroxine (SYNTHROID, LEVOTHROID) 75 MCG tablet Take 75 mcg by mouth every evening.     metoprolol succinate (TOPROL-XL) 50 MG 24 hr tablet Take 50 mg by mouth daily.    polyethylene glycol powder (GLYCOLAX/MIRALAX) powder Take 17 g by mouth daily.     potassium chloride (K-DUR) 10 MEQ tablet Take 10 mEq by mouth daily.    senna (SENOKOT) 8.6 MG tablet Take 1 tablet by mouth 2 (two) times daily.    thiothixene (NAVANE) 2 MG capsule Take 4 mg by mouth 2 (two) times daily.     traZODone (DESYREL) 50 MG tablet Take 25 mg by mouth at bedtime as needed for sleep.    triamcinolone cream (KENALOG) 0.1 % Apply 1 application topically 2 (two) times daily as needed. To rash on eyebrows/ chin    venlafaxine XR (EFFEXOR-XR) 150 MG 24 hr capsule Take 150 mg by mouth at bedtime.    tiotropium (SPIRIVA) 18 MCG inhalation capsule Place 18 mcg into inhaler  and inhale daily.     !! - Potential duplicate medications found. Please discuss with provider.    STOP taking these medications     insulin glargine (LANTUS) 100 UNIT/ML injection      fentaNYL (DURAGESIC - DOSED MCG/HR) 12 MCG/HR          DISCHARGE INSTRUCTIONS:   Follow-up with Dr. Annamarie Major in a few days  If you experience worsening of your admission symptoms, develop shortness of breath, life threatening emergency, suicidal or homicidal thoughts you must seek medical attention immediately by calling 911 or calling your MD immediately  if symptoms less severe.  You Must read complete instructions/literature along with all the possible adverse reactions/side effects for all the Medicines you take and that have been prescribed to  you. Take any new Medicines after you have completely understood and accept all the possible adverse reactions/side effects.   Please note  You were cared for by a hospitalist during your hospital stay. If you have any questions about your discharge medications or the care you received while you were in the hospital after you are discharged, you can call the unit and asked to speak with the hospitalist on call if the hospitalist that took care of you is not available. Once you are discharged, your primary care physician will handle any further medical issues. Please note that NO REFILLS for any discharge medications will be authorized once you are discharged, as it is imperative that you return to your primary care physician (or establish a relationship with a primary care physician if you do not have one) for your aftercare needs so that they can reassess your need for medications and monitor your lab values.    Today   CHIEF COMPLAINT:   Chief Complaint  Patient presents with  . Emesis    HISTORY OF PRESENT ILLNESS:  Susan Graham  is a 71 y.o. female with a known history of Breast cancer which the patient does not know about presented with vomiting  and found to have clinical sepsis and UTI   VITAL SIGNS:  Blood pressure (!) 175/67, pulse 71, temperature 98.9 F (37.2 C), temperature source Oral, resp. rate 18, height 5\' 4"  (1.626 m), weight 92.4 kg (203 lb 11.2 oz), SpO2 94 %.    PHYSICAL EXAMINATION:  GENERAL:  71 y.o.-year-old patient lying in the bed with no acute distress.  EYES: Pupils equal, round, reactive to light and accommodation. No scleral icterus. Extraocular muscles intact.  HEENT: Head atraumatic, normocephalic. Oropharynx and nasopharynx clear.  NECK:  Supple, no jugular venous distention. No thyroid enlargement, no tenderness.  LUNGS: Decreased breath sounds bilaterally, no wheezing, rales,rhonchi or crepitation. No use of accessory muscles of respiration.  CARDIOVASCULAR: S1, S2 normal. No murmurs, rubs, or gallops.  ABDOMEN: Soft, non-tender, non-distended. Bowel sounds present. No organomegaly or mass.  EXTREMITIES: 3+ edema, no cyanosis, or clubbing.  NEUROLOGIC: Sensation intact. Gait not checked.  PSYCHIATRIC: The patient is alert.  SKIN: No obvious rash, lesion, or ulcer.   DATA REVIEW:   CBC  Recent Labs Lab 04/09/16 0510  WBC 13.0*  HGB 9.0*  HCT 27.1*  PLT 214    Chemistries   Recent Labs Lab 04/06/16 0750 04/06/16 1133  04/09/16 0510  NA 133*  --   < > 132*  K 4.1  --   < > 4.2  CL 98*  --   < > 99*  CO2 26  --   < > 25  GLUCOSE 112*  --   < > 152*  BUN 52*  --   < > 42*  CREATININE 2.21* 2.02*  < > 1.48*  CALCIUM 9.3  --   < > 8.9  MG  --  2.3  --   --   AST 11*  --   --   --   ALT 7*  --   --   --   ALKPHOS 54  --   --   --   BILITOT 0.6  --   --   --   < > = values in this interval not displayed.  Cardiac Enzymes  Recent Labs Lab 04/06/16 1855  TROPONINI 0.12*    Microbiology Results  Results for orders placed or performed during the hospital encounter of  04/06/16  Urine culture     Status: Abnormal   Collection Time: 04/06/16  8:09 AM  Result Value Ref Range  Status   Specimen Description URINE, RANDOM  Final   Special Requests NONE  Final   Culture >=100,000 COLONIES/mL KLEBSIELLA PNEUMONIAE (A)  Final   Report Status 04/08/2016 FINAL  Final   Organism ID, Bacteria KLEBSIELLA PNEUMONIAE (A)  Final      Susceptibility   Klebsiella pneumoniae - MIC*    AMPICILLIN >=32 RESISTANT Resistant     CEFAZOLIN <=4 SENSITIVE Sensitive     CEFTRIAXONE <=1 SENSITIVE Sensitive     CIPROFLOXACIN <=0.25 SENSITIVE Sensitive     GENTAMICIN <=1 SENSITIVE Sensitive     IMIPENEM <=0.25 SENSITIVE Sensitive     NITROFURANTOIN 64 INTERMEDIATE Intermediate     TRIMETH/SULFA <=20 SENSITIVE Sensitive     AMPICILLIN/SULBACTAM >=32 RESISTANT Resistant     PIP/TAZO 8 SENSITIVE Sensitive     Extended ESBL NEGATIVE Sensitive     * >=100,000 COLONIES/mL KLEBSIELLA PNEUMONIAE  Blood culture (routine x 2)     Status: None (Preliminary result)   Collection Time: 04/06/16  9:32 AM  Result Value Ref Range Status   Specimen Description BLOOD LEFT ARM  Final   Special Requests BOTTLES DRAWN AEROBIC AND ANAEROBIC  BCAV  Final   Culture NO GROWTH 4 DAYS  Final   Report Status PENDING  Incomplete  Blood culture (routine x 2)     Status: None (Preliminary result)   Collection Time: 04/06/16  9:33 AM  Result Value Ref Range Status   Specimen Description BLOOD  RIGHT ARM  Final   Special Requests BOTTLES DRAWN AEROBIC AND ANAEROBIC  BCAV  Final   Culture NO GROWTH 4 DAYS  Final   Report Status PENDING  Incomplete  MRSA PCR Screening     Status: None   Collection Time: 04/06/16 11:29 AM  Result Value Ref Range Status   MRSA by PCR NEGATIVE NEGATIVE Final    Comment:        The GeneXpert MRSA Assay (FDA approved for NASAL specimens only), is one component of a comprehensive MRSA colonization surveillance program. It is not intended to diagnose MRSA infection nor to guide or monitor treatment for MRSA infections.     RADIOLOGY:  Dg Chest Port 1 View  Result Date:  04/10/2016 CLINICAL DATA:  COPD. EXAM: PORTABLE CHEST 1 VIEW COMPARISON:  04/08/2016. FINDINGS: Prior CABG. Cardiomegaly with diffuse bilateral pulmonary interstitial prominence. Left pleural effusion again noted.Findings consistent with CHF. Findings have improved slightly from prior exam . No pneumothorax . IMPRESSION: Prior CABG. Cardiomegaly with diffuse bilateral pulmonary interstitial prominence and small left pleural effusion consistent with CHF. Findings have improved slightly from prior exam . Electronically Signed   By: [place]   On: 04/10/2016 07:35    Management plans discussed with the patient, family and they are in agreement.  CODE STATUS:     Code Status Orders        Start     Ordered   04/06/16 1108  Do not attempt resuscitation (DNR)  Continuous    Question Answer Comment  In the event of cardiac or respiratory ARREST Do not call a "code blue"   In the event of cardiac or respiratory ARREST Do not perform Intubation, CPR, defibrillation or ACLS   In the event of cardiac or respiratory ARREST Use medication by any route, position, wound care, and other measures to relive pain and suffering. May  use oxygen, suction and manual treatment of airway obstruction as needed for comfort.      04/06/16 1108    Code Status History    Date Active Date Inactive Code Status Order ID Comments User Context   08/31/2014  8:44 PM 09/01/2014  7:17 PM DNR [id number]  Vaughan Basta, MD Inpatient      TOTAL TIME TAKING CARE OF THIS PATIENT: 35 minutes.    Loletha Grayer M.D on 04/10/2016 at 9:55 AM  Between 7am to 6pm - Pager - [phone]  After 6pm go to [email or web address] - password [email or web address]  [facility] Office  [phone]  CC: Primary care physician; Viviana Simpler, MD

## 2016-04-10 NOTE — Progress Notes (Signed)
Visit made. Patient seen lying in bed, staff aide present to provide personal care. Patient greeted Probation officer with her usual "hi Baby", no other verbal response noted. Plan is for her to discharge today back to [facility] to continue hospice services. Chart notes reviewed. Discharge summary faxed to triage. Hospice team alerted to discharge. Thank you.  Flo Shanks RN, BSN, [facility] [place], hospital liaison [phone] c

## 2016-04-10 NOTE — Clinical Social Work Note (Signed)
Patient to discharge to return to [facility]. Seth Bake at [facility] is aware and discharge information has been sent. Patient's boyfriend has been in patient's room today and has been made aware of discharge. Santiago Glad with Hospice is aware of discharge and to continue services at [facility]. Shela Leff MSW,LCSW [phone]

## 2016-04-11 LAB — CULTURE, BLOOD (ROUTINE X 2)
CULTURE: NO GROWTH
CULTURE: NO GROWTH

## 2016-04-13 ENCOUNTER — Ambulatory Visit: Admitting: Internal Medicine

## 2016-04-13 DIAGNOSIS — C50919 Malignant neoplasm of unspecified site of unspecified female breast: Secondary | ICD-10-CM | POA: Diagnosis not present

## 2016-04-13 DIAGNOSIS — I5032 Chronic diastolic (congestive) heart failure: Secondary | ICD-10-CM | POA: Diagnosis not present

## 2016-04-13 DIAGNOSIS — E1121 Type 2 diabetes mellitus with diabetic nephropathy: Secondary | ICD-10-CM | POA: Diagnosis not present

## 2016-04-13 DIAGNOSIS — A419 Sepsis, unspecified organism: Secondary | ICD-10-CM | POA: Diagnosis not present

## 2016-04-13 LAB — ECHOCARDIOGRAM COMPLETE
Height: 64 in
Weight: 3259.2 oz

## 2016-05-17 DIAGNOSIS — I5032 Chronic diastolic (congestive) heart failure: Secondary | ICD-10-CM

## 2016-05-17 DIAGNOSIS — E119 Type 2 diabetes mellitus without complications: Secondary | ICD-10-CM | POA: Diagnosis not present

## 2016-05-17 DIAGNOSIS — J449 Chronic obstructive pulmonary disease, unspecified: Secondary | ICD-10-CM | POA: Diagnosis not present

## 2016-05-17 DIAGNOSIS — I110 Hypertensive heart disease with heart failure: Secondary | ICD-10-CM | POA: Diagnosis not present

## 2016-05-22 DIAGNOSIS — M1990 Unspecified osteoarthritis, unspecified site: Secondary | ICD-10-CM | POA: Diagnosis not present

## 2016-05-22 DIAGNOSIS — I5020 Unspecified systolic (congestive) heart failure: Secondary | ICD-10-CM | POA: Diagnosis not present

## 2016-05-22 DIAGNOSIS — C50911 Malignant neoplasm of unspecified site of right female breast: Secondary | ICD-10-CM | POA: Diagnosis not present

## 2016-05-22 DIAGNOSIS — E119 Type 2 diabetes mellitus without complications: Secondary | ICD-10-CM

## 2016-05-22 DIAGNOSIS — J069 Acute upper respiratory infection, unspecified: Secondary | ICD-10-CM | POA: Diagnosis not present

## 2016-05-22 DIAGNOSIS — I25110 Atherosclerotic heart disease of native coronary artery with unstable angina pectoris: Secondary | ICD-10-CM | POA: Diagnosis not present

## 2016-05-22 DIAGNOSIS — F3170 Bipolar disorder, currently in remission, most recent episode unspecified: Secondary | ICD-10-CM | POA: Diagnosis not present

## 2016-07-06 DIAGNOSIS — K1370 Unspecified lesions of oral mucosa: Secondary | ICD-10-CM

## 2016-07-16 DIAGNOSIS — I110 Hypertensive heart disease with heart failure: Secondary | ICD-10-CM | POA: Diagnosis not present

## 2016-07-16 DIAGNOSIS — I5032 Chronic diastolic (congestive) heart failure: Secondary | ICD-10-CM

## 2016-07-16 DIAGNOSIS — E119 Type 2 diabetes mellitus without complications: Secondary | ICD-10-CM

## 2016-07-16 DIAGNOSIS — J449 Chronic obstructive pulmonary disease, unspecified: Secondary | ICD-10-CM

## 2016-08-31 DIAGNOSIS — H10023 Other mucopurulent conjunctivitis, bilateral: Secondary | ICD-10-CM | POA: Diagnosis not present

## 2016-09-26 DEATH — deceased
